# Patient Record
Sex: Female | Born: 1965 | Race: White | Hispanic: No | Marital: Married | Smoking: Never smoker
Health system: Southern US, Community
[De-identification: ages and names within clinical notes are randomized; demographics above are authoritative.]

## PROBLEM LIST (undated history)

## (undated) DIAGNOSIS — U071 COVID-19: Secondary | ICD-10-CM

## (undated) DIAGNOSIS — Z9189 Other specified personal risk factors, not elsewhere classified: Secondary | ICD-10-CM

## (undated) HISTORY — PX: BREAST EXCISIONAL BIOPSY: SUR124

## (undated) HISTORY — PX: ABDOMINAL HYSTERECTOMY: SHX81

## (undated) HISTORY — DX: COVID-19: U07.1

---
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
## 2002-03-23 ENCOUNTER — Encounter: Payer: Self-pay | Admitting: Obstetrics and Gynecology

## 2002-03-23 ENCOUNTER — Ambulatory Visit (HOSPITAL_COMMUNITY): Admission: RE | Admit: 2002-03-23 | Discharge: 2002-03-23 | Payer: Self-pay | Admitting: Obstetrics and Gynecology

## 2002-10-30 ENCOUNTER — Observation Stay (HOSPITAL_COMMUNITY): Admission: RE | Admit: 2002-10-30 | Discharge: 2002-10-31 | Payer: Self-pay | Admitting: Obstetrics and Gynecology

## 2002-10-30 ENCOUNTER — Encounter (INDEPENDENT_AMBULATORY_CARE_PROVIDER_SITE_OTHER): Payer: Self-pay | Admitting: Specialist

## 2005-02-02 ENCOUNTER — Other Ambulatory Visit: Admission: RE | Admit: 2005-02-02 | Discharge: 2005-02-02 | Payer: Self-pay | Admitting: Obstetrics and Gynecology

## 2005-02-12 ENCOUNTER — Encounter: Admission: RE | Admit: 2005-02-12 | Discharge: 2005-02-12 | Payer: Self-pay | Admitting: Obstetrics and Gynecology

## 2005-03-11 ENCOUNTER — Ambulatory Visit (HOSPITAL_BASED_OUTPATIENT_CLINIC_OR_DEPARTMENT_OTHER): Admission: RE | Admit: 2005-03-11 | Discharge: 2005-03-11 | Payer: Self-pay | Admitting: General Surgery

## 2005-03-11 ENCOUNTER — Ambulatory Visit (HOSPITAL_COMMUNITY): Admission: RE | Admit: 2005-03-11 | Discharge: 2005-03-11 | Payer: Self-pay | Admitting: General Surgery

## 2005-03-11 ENCOUNTER — Encounter (INDEPENDENT_AMBULATORY_CARE_PROVIDER_SITE_OTHER): Payer: Self-pay | Admitting: Specialist

## 2007-06-08 ENCOUNTER — Encounter: Admission: RE | Admit: 2007-06-08 | Discharge: 2007-06-08 | Payer: Self-pay | Admitting: Obstetrics and Gynecology

## 2008-10-09 ENCOUNTER — Encounter: Admission: RE | Admit: 2008-10-09 | Discharge: 2008-10-09 | Payer: Self-pay | Admitting: Obstetrics and Gynecology

## 2009-08-12 ENCOUNTER — Ambulatory Visit: Payer: Self-pay | Admitting: Family Medicine

## 2009-08-12 DIAGNOSIS — L989 Disorder of the skin and subcutaneous tissue, unspecified: Secondary | ICD-10-CM | POA: Insufficient documentation

## 2009-08-12 DIAGNOSIS — Z9189 Other specified personal risk factors, not elsewhere classified: Secondary | ICD-10-CM | POA: Insufficient documentation

## 2009-08-12 HISTORY — DX: Other specified personal risk factors, not elsewhere classified: Z91.89

## 2009-09-23 ENCOUNTER — Encounter: Payer: Self-pay | Admitting: Family Medicine

## 2009-09-26 ENCOUNTER — Ambulatory Visit: Payer: Self-pay | Admitting: Family Medicine

## 2009-09-26 DIAGNOSIS — R5383 Other fatigue: Secondary | ICD-10-CM

## 2009-09-26 DIAGNOSIS — R5381 Other malaise: Secondary | ICD-10-CM

## 2009-09-26 LAB — CONVERTED CEMR LAB
ALT: 15 units/L (ref 0–35)
AST: 16 units/L (ref 0–37)
Alkaline Phosphatase: 42 units/L (ref 39–117)
BUN: 10 mg/dL (ref 6–23)
Basophils Absolute: 0 10*3/uL (ref 0.0–0.1)
Calcium: 8.7 mg/dL (ref 8.4–10.5)
Eosinophils Relative: 0.7 % (ref 0.0–5.0)
GFR calc non Af Amer: 96.17 mL/min (ref >60)
Glucose, Bld: 66 mg/dL — ABNORMAL LOW (ref 70–99)
HCT: 41.6 % (ref 36.0–46.0)
HDL: 50.7 mg/dL (ref >39.00)
Hemoglobin: 13.9 g/dL (ref 12.0–15.0)
LDL Cholesterol: 92 mg/dL (ref 0–99)
Lymphocytes Relative: 24 % (ref 12.0–46.0)
Lymphs Abs: 1.6 10*3/uL (ref 0.7–4.0)
Monocytes Relative: 13.9 % — ABNORMAL HIGH (ref 3.0–12.0)
Platelets: 250 10*3/uL (ref 150.0–400.0)
Potassium: 4.1 meq/L (ref 3.5–5.1)
RDW: 13.2 % (ref 11.5–14.6)
Sodium: 135 meq/L (ref 135–145)
Total Bilirubin: 0.5 mg/dL (ref 0.3–1.2)
VLDL: 6.2 mg/dL (ref 0.0–40.0)
WBC: 6.5 10*3/uL (ref 4.5–10.5)

## 2009-10-07 ENCOUNTER — Ambulatory Visit: Payer: Self-pay | Admitting: Family Medicine

## 2009-10-10 ENCOUNTER — Encounter: Admission: RE | Admit: 2009-10-10 | Discharge: 2009-10-10 | Payer: Self-pay | Admitting: Obstetrics and Gynecology

## 2011-04-17 NOTE — H&P (Signed)
   NAME:  Heidi [name], Heidi [name]                           ACCOUNT NO.:  [id number]   MEDICAL RECORD NO.:  [id number]                   PATIENT TYPE:  OBV   LOCATION:  9399                                 FACILITY:  [facility]   PHYSICIAN:  Dineen Kid. Rana Snare, M.D.                 DATE OF BIRTH:  1966/04/22   DATE OF ADMISSION:  10/30/2002  DATE OF DISCHARGE:                                HISTORY & PHYSICAL   HISTORY OF PRESENT ILLNESS:  Heidi [name] is a 45 year old G2, P2, presented to  me in 8/02, having just moved to the city from [place]. She was recently  diagnosed enlarged uterus and remains relatively asymptomatic; however,  after one year of following, they have grown to approximately 14 weeks size.  She has also pelvic pressure because of uterine size, pelvic pressure and  fibroids. Presents today for laparoscopic assisted vaginal hysterectomy.   PAST MEDICAL HISTORY:  Negative.   PAST SURGICAL HISTORY:  Negative.   PAST OBSTETRICAL HISTORY:  She has had two vaginal deliveries.   MEDICATIONS:  None.   ALLERGIES:  No known drug allergies.   PHYSICAL EXAMINATION:  VITAL SIGNS:  Blood pressure 100/16, weight 116.  HEART:  Regular, rate, and rhythm.  LUNGS:  Clear to auscultation bilaterally.  ABDOMEN:  Nondistended, nontender. Uterus is palpable to approximately two  to three fingerbreadths above the pubic symphysis. Mobility of the uterus is  more elongated than it is globular.   LABORATORY DATA:  Ultrasound shows an enlarged uterus. Last ultrasound in  11/02:  Uterus measuring 9 x 6 x 6 cm with fibroids throughout with the  largest measuring 5.3 cm in size. Ovaries appeared normal.   IMPRESSION AND PLAN:  A 14-week size fibroid uterus, pelvic pressure.   PLAN:  Laparoscopic assisted vaginal hysterectomy. The patient has no  further childbearing desires and wishes definite surgical treatment. Risks  and benefits were discussed at length which includes but is not limited to  risk of  infection, bleeding; damage to bowel, bladder, ureters; risks  associated with blood transfusion and anesthesia. Informed consent was  obtained.                                               Dineen Kid Rana Snare, M.D.    DCL/MEDQ  D:  10/30/2002  T:  10/30/2002  Job:  [id number]

## 2011-04-17 NOTE — Op Note (Signed)
[name], Heidi                 ACCOUNT NO.:  [id number]   MEDICAL RECORD NO.:  [id number]          PATIENT TYPE:  AMB   LOCATION:  DSC                          FACILITY:  [facility]   PHYSICIAN:  Rose Phi. Maple Hudson, M.D.   DATE OF BIRTH:  10-29-1966   DATE OF PROCEDURE:  03/11/2005  DATE OF DISCHARGE:                                 OPERATIVE REPORT   PREOPERATIVE DIAGNOSIS:  Radial scar left breast.   POSTOPERATIVE DIAGNOSIS:  Radial scar left breast.   OPERATION:  Excision of radial scar left breast.   SURGEON:  Rose Phi. Maple Hudson, M.D.   ANESTHESIA:  MAC   OPERATIVE PROCEDURE:  This 45 year old female had presented with a routine  mammogram showing a radial scar deformity at the 12 o'clock position of the  left breast with some associated cystic change. She had a palpable area of  abnormality and was scheduled for a left breast biopsy.   The patient was placed on the operating table with arms extended on the arm  board; and the left breast prepped and draped in usual fashion. The area of  concern was palpable at the 12 o'clock position just above the areola; and  so a curved incision was then outlined layer and the area thoroughly  infiltrated with a local anesthetic. Incision was made and exposed the  breast tissue and excised this which was rather hard and very fibrotic.  Hemostasis obtained with the cautery. Several little cysts were ruptured as  we did that. I then closed the skin with a 4-0 subcuticular Monocryl with  Steri-Strips. Specimen submitted to the pathologist.   Dressing was applied; and the patient transferred to recovery room in  satisfactory condition having tolerated procedure well.      PRY/MEDQ  D:  03/11/2005  T:  03/11/2005  Job:  [id number]

## 2011-04-17 NOTE — Op Note (Signed)
NAME:  Heidi [name], Heidi [name]                           ACCOUNT NO.:  [id number]   MEDICAL RECORD NO.:  [id number]                   PATIENT TYPE:  OBV   LOCATION:  9399                                 [facility]ACILITY:  [facility]   PHYSICIAN:  Dineen Kid. Rana Snare, M.D.                 DATE OF BIRTH:  09-08-1966   DATE OF PROCEDURE:  10/30/2002  DATE OF DISCHARGE:                                 OPERATIVE REPORT   PREOPERATIVE DIAGNOSIS:  A 14-week size fibroid uterus with pelvic pressure.   POSTOPERATIVE DIAGNOSES:  1. A 14-week size fibroid uterus with pelvic pressure.  2. Left paratubal cyst.   PROCEDURE:  Laparoscopically assisted vaginal hysterectomy and left  paratubal cyst removal.   SURGEON:  Dineen Kid. Rana Snare, M.D.   ASSISTANT:  Duke Salvia. Marcelle Overlie, M.D.   ANESTHESIA:  General endotracheal anesthesia.   INDICATIONS FOR PROCEDURE:  The patient is a 45 year old G2, P2, with  enlarging uterus first diagnosed one year ago, now measuring approximately  14-week size.  Because of enlarging uterus, 14-week size and pelvic pressure  and pelvic pressure, she presents today for laparoscopically assisted  vaginal hysterectomy.  Risks and benefits were discussed at length which  include, but are not limited to, risks of infection, bleeding, damage to  bowel, bladder or ureters; risk of subsequent blood transfusion, anesthesia.  Informed consent was obtained.   FINDINGS:  A 14-week size fibroid uterus, normal-appearing ovaries. There  was a left paratubal cyst.  Normal-appearing appendix, gallbladder and  liver.   DESCRIPTION OF PROCEDURE:  After adequate analgesia, the patient was placed  in the dorsal lithotomy position, sterilely prepped and draped.  The bladder  was sterilely drained.  Hulka tenaculum was placed in the anterior lip of  the cervix.  A 1 cm infraumbilical skin incision was made.  A Veress needle  was inserted.  The abdomen was insufflated with dullness to percussion.  An  11 mm trocar was  inserted and the above findings were noted.  A 5 mm trocar  was inserted to the left of midline two fingerbreadths above the pubic  symphysis under direct visualization.  The gyrus tripolar ligature was used  to ligate down the left and right utero-ovarian ligaments across the  fallopian tubes, across the utero-ovarian ligaments, down across the round  ligament with good hemostasis achieved.  At this point the ovary and tube  fell lateral.  The bladder was elevated with grasper.  A small incision was  made at the reflexion of the bladder to the uterus.  It was irrigated with  irrigant.  At this point, good hemostasis was achieved.  The abdomen was  desufflated.  The legs were elevated.  A Jacobs tenaculum was placed in the  posterior lip of the cervix.  A posterior colpotomy was then performed.  Cervix was circumscribed with Bovie cautery.  LigaSure ligator was used to  ligate  the uterosacral ligaments bilaterally and cut.  The cardinal  ligaments were ligated  and cut bilaterally.  The bladder was dissected off  the anterior insertion of the cervix, entered anteriorly and Deaver  retractor placed underneath the bladder.  LigaSure was used successfully  across the uterine vasculature, inferior portion of the broad ligaments  bilaterally.  Good hemostasis was achieved along the way.  At this point the  uterine fundus was grasped, delivered into the introitus, and the uterus was  removed.  At this time, Verda Cumins packing was placed.  The uterosacral  ligaments were identified and ligated with a suture of 0 Monocryl in figure-  of-eight fashion.  Posterior peritoneum was then closed in a pursestring  fashion.  The posterior fascial mucosa was then closed in vertical fashion  using figure-of-eight and 0 Monocryl.  The packing was then removed and the  anterior vaginal mucosa was closed in similar fashion using figure-of-eights  and 0 Monocryl with good approximation and good hemostasis.  Foley  catheter  was placed with return of clear yellow urine.  The patient had asked a small  skin tag to be removed from the right perineal body.  This was removed  easily with Bovie cautery and closed with 3-0 Vicryl subcuticular stitch in  a horizontal mattress.  Legs were lowered.  The abdomen was reinsufflated  and examination with the laparoscope was carried out.  The gyrus was used to  ligate several small peritoneal edges that were bleeding.  The paratubal  cyst was grasped, ligated with the  gyrus and easily removed.  After copious  irrigation, adequate hemostasis was assured, the abdomen was desufflated.  The trocars were removed.  The infraumbilical skin incision was closed with  0 Vicryl figure-of-eight in the fascia and the 3-0 Vicryl Rapide  subcuticular stitch, 5 mm trocar site closed with 3-0 Vicryl Rapide  subcuticular stitch and the incisions were injected with 0.25% Marcaine.  The patient was stable on transfer to the recovery room.  Sponge, needle and  instrument counts were correct x3.  Estimated blood loss was 200 cc.  The  patient received 1 g of cefotetan preoperatively.                                               Dineen Kid Rana Snare, M.D.    DCL/MEDQ  D:  10/30/2002  T:  10/30/2002  Job:  [id number]

## 2017-11-09 LAB — HM DEXA SCAN

## 2018-10-24 LAB — HM MAMMOGRAPHY

## 2018-10-24 LAB — HM PAP SMEAR: HM Pap smear: NEGATIVE

## 2019-05-11 ENCOUNTER — Telehealth: Payer: Self-pay | Admitting: Family Medicine

## 2019-05-11 NOTE — Telephone Encounter (Signed)
Heidi [name],   I do not want to take on new primary care patients for the remainder of my career.  10 years is new.    Heidi [name], with some of the turnover, can you make sure everyone knows that? Y'all don't need to ask. From now on, I won't be accepting transfers or family member new patients either.   I appreciate your help!

## 2019-05-11 NOTE — Telephone Encounter (Signed)
Patient called today to request an appointment, She has not been seen in 10 years and wanted to see if she is able to get reestablished. Patient also mentioned about an anti body test for the covid and if we could point her in the right directions where she could get this done.   Are you able to see her so she can be reestablished or okay to see if she could see someone else in the office?    Thanks    Patient's Phone-  [phone]

## 2019-05-11 NOTE — Telephone Encounter (Signed)
Noted  

## 2019-05-18 ENCOUNTER — Encounter: Payer: Self-pay | Admitting: Primary Care

## 2019-05-18 ENCOUNTER — Ambulatory Visit: Payer: No Typology Code available for payment source | Admitting: Primary Care

## 2019-05-18 ENCOUNTER — Other Ambulatory Visit: Payer: Self-pay

## 2019-05-18 VITALS — BP 130/84 | HR 81 | Temp 98.0°F | Ht 62.5 in | Wt 139.0 lb

## 2019-05-18 DIAGNOSIS — I499 Cardiac arrhythmia, unspecified: Secondary | ICD-10-CM

## 2019-05-18 DIAGNOSIS — Z8249 Family history of ischemic heart disease and other diseases of the circulatory system: Secondary | ICD-10-CM

## 2019-05-18 NOTE — Assessment & Plan Note (Signed)
Noted on exam today, patient also told about this several months ago.   ECG today with NSR with rate of 75. No PAC/PVC, ST changes. ECG did not capture the irregular beat that was noted on auscultation.   Give significant family history of heart disease, the irregular rhythm that was noted upon auscultation without capturing ECG, will send to cardiology for evaluation. She agrees.

## 2019-05-18 NOTE — Patient Instructions (Signed)
You will be contacted regarding your referral to Cardiology.  Please let us know if you have not been contacted within one week.   Please schedule a physical with me for November/December this year. You may also schedule a lab only appointment 3-4 days prior. We will discuss your lab results in detail during your physical.  It was a pleasure to meet you today! Please don't hesitate to call or message me with any questions. Welcome to [facility]!

## 2019-05-18 NOTE — Progress Notes (Signed)
Subjective:    Patient ID: Heidi [name], female    DOB: 11-27-1966, 53 y.o.   MRN: [id number]  HPI  Ms. Heidi [name] is a 53 year old female who presents today to establish care and discuss the problems mentioned below. Will obtain/review records.  She is following with her GYN for routine care but would like to transition her care to a PCP. She had her mammogram and pap smear in November 2019. Also had labs completed in 2018 endorses normal labs. She will be due for her annual exam in November/December 2020.  1) Irregular Heart Rhythm: Notified of this several months ago when she completed a basic health assessment through an outside company. She was told that she "had an extra heartbeat" and that it was nothing to worry about. She denies chest pain, dizziness, shortness of breath. She has a family history of atrial fibrillation and CAD in her mother, CAD with cardiac bypass in her father. She denies every having an ECG.  Review of Systems  Respiratory: Negative for shortness of breath.   Cardiovascular: Negative for chest pain.  Genitourinary:       Hysterectomy  Neurological: Negative for dizziness and headaches.       History reviewed. No pertinent past medical history.   Social History   Socioeconomic History  . Marital status: Divorced    Spouse name: Not on file  . Number of children: Not on file  . Years of education: Not on file  . Highest education level: Not on file  Occupational History  . Not on file  Social Needs  . Financial resource strain: Not on file  . Food insecurity    Worry: Not on file    Inability: Not on file  . Transportation needs    Medical: Not on file    Non-medical: Not on file  Tobacco Use  . Smoking status: Never Smoker  . Smokeless tobacco: Never Used  Substance and Sexual Activity  . Alcohol use: Not Currently  . Drug use: Not on file  . Sexual activity: Not on file  Lifestyle  . Physical activity    Days per week: Not on file   Minutes per session: Not on file  . Stress: Not on file  Relationships  . Social Herbalist on phone: Not on file    Gets together: Not on file    Attends religious service: Not on file    Active member of club or organization: Not on file    Attends meetings of clubs or organizations: Not on file    Relationship status: Not on file  . Intimate partner violence    Fear of current or ex partner: Not on file    Emotionally abused: Not on file    Physically abused: Not on file    Forced sexual activity: Not on file  Other Topics Concern  . Not on file  Social History Narrative   Married.   2 children.   Works as a Media planner.    Enjoys playing musical instruments, sewing, cooking, gardening.    Past Surgical History:  Procedure Laterality Date  . ABDOMINAL HYSTERECTOMY      Family History  Problem Relation Age of Onset  . Atrial fibrillation Mother   . Macular degeneration Mother   . Hypertension Mother   . Heart attack Father 68  . CAD Father   . Heart disease Brother     No Known Allergies  No current  outpatient medications on file prior to visit.   No current facility-administered medications on file prior to visit.     BP 130/84   Pulse 81   Temp 98 F (36.7 C) (Tympanic)   Ht 5' 2.5" (1.588 m)   Wt 139 lb (63 kg)   SpO2 98%   BMI 25.02 kg/m    Objective:   Physical Exam  Constitutional: She appears well-nourished.  Neck: Neck supple.  Cardiovascular: Normal rate. A regularly irregular rhythm present.  Respiratory: Effort normal and breath sounds normal.  Skin: Skin is warm and dry.  Psychiatric: She has a normal mood and affect.           Assessment & Plan:

## 2019-05-25 ENCOUNTER — Encounter: Payer: Self-pay | Admitting: Primary Care

## 2019-06-13 ENCOUNTER — Other Ambulatory Visit: Payer: Self-pay

## 2019-06-13 DIAGNOSIS — E039 Hypothyroidism, unspecified: Secondary | ICD-10-CM | POA: Insufficient documentation

## 2019-06-13 DIAGNOSIS — R000 Tachycardia, unspecified: Secondary | ICD-10-CM | POA: Diagnosis present

## 2019-06-13 LAB — CBC
HCT: 41.1 % (ref 36.0–46.0)
Hemoglobin: 13.5 g/dL (ref 12.0–15.0)
MCH: 29.7 pg (ref 26.0–34.0)
MCHC: 32.8 g/dL (ref 30.0–36.0)
MCV: 90.5 fL (ref 80.0–100.0)
Platelets: 280 10*3/uL (ref 150–400)
RBC: 4.54 MIL/uL (ref 3.87–5.11)
RDW: 13.8 % (ref 11.5–15.5)
WBC: 8 10*3/uL (ref 4.0–10.5)
nRBC: 0 % (ref 0.0–0.2)

## 2019-06-13 NOTE — ED Triage Notes (Signed)
Pt arrives to ED via POV from home with c/o tachycardia x2 weeks. Pt states s/x's only happen at night when she lays down to sleep. Pt denies any c/o chest pain or SHOB, no N/V/D. Pt denies dizziness or lightheadedness, no unilateral weakness or numbness. Pt states she has a doctor appt on August 18th with the cardiologist, but didn't want to wait that long. Pt is A&O, in NAD; RR even, regular, or unlabored

## 2019-06-14 ENCOUNTER — Emergency Department
Admission: EM | Admit: 2019-06-14 | Discharge: 2019-06-14 | Disposition: A | Discharge status: Home / Self Care | Payer: No Typology Code available for payment source | Attending: Emergency Medicine | Admitting: Emergency Medicine

## 2019-06-14 ENCOUNTER — Emergency Department: Payer: No Typology Code available for payment source

## 2019-06-14 DIAGNOSIS — R002 Palpitations: Secondary | ICD-10-CM

## 2019-06-14 DIAGNOSIS — E039 Hypothyroidism, unspecified: Secondary | ICD-10-CM

## 2019-06-14 LAB — BASIC METABOLIC PANEL
Anion gap: 9 (ref 5–15)
BUN: 17 mg/dL (ref 6–20)
CO2: 28 mmol/L (ref 22–32)
Calcium: 9.3 mg/dL (ref 8.9–10.3)
Chloride: 103 mmol/L (ref 98–111)
Creatinine, Ser: 0.94 mg/dL (ref 0.44–1.00)
GFR calc Af Amer: >60 mL/min (ref >60)
GFR calc non Af Amer: >60 mL/min (ref >60)
Glucose, Bld: 106 mg/dL — ABNORMAL HIGH (ref 70–99)
Potassium: 3.6 mmol/L (ref 3.5–5.1)
Sodium: 140 mmol/L (ref 135–145)

## 2019-06-14 LAB — TSH: TSH: 6.213 u[IU]/mL — ABNORMAL HIGH (ref 0.350–4.500)

## 2019-06-14 LAB — TROPONIN I (HIGH SENSITIVITY): Troponin I (High Sensitivity): 3 ng/L (ref <18)

## 2019-06-14 NOTE — ED Notes (Signed)
Patient transported to X-ray at this time 

## 2019-06-14 NOTE — ED Notes (Signed)
Lab notified of TSH lab test as ordered by Dr Owens Shark. Lab tech reports they have a sufficient amount of blood leftover from an earlier draw and will begin running analysis at this time.

## 2019-06-14 NOTE — ED Provider Notes (Signed)
[facility] Emergency Department Provider Note    First MD Initiated Contact with Patient 06/14/19 [phone]     (approximate)  I have reviewed the triage vital signs and the nursing notes.   HISTORY  Chief Complaint Tachycardia    HPI Heidi [name] is a 53 y.o. female presents to the emergency department with a 2-week history of rapid/irregular heartbeat which patient states that she only notices at night.  Patient denies any chest pain no shortness of breath no nausea vomiting diarrhea.  Patient denies any dizziness no headaches.  Patient states that when she does notice it and it feels as though it is persistent and goes on all night.  Patient states that she feels that currently however patient's heart rate is currently 90 and normal sinus rhythm noted on the monitor.  Patient states that her younger brother recently had to have a pacer defibrillator placed however she is unsure why that was done.    Past Medical History:  Diagnosis Date  . CHICKENPOX, HX OF 08/12/2009   Qualifier: Diagnosis of  By: Ellin Mayhew CMA Deborra Medina), Heather      Patient Active Problem List   Diagnosis Date Noted  . Irregular heart rhythm 05/18/2019    Past Surgical History:  Procedure Laterality Date  . ABDOMINAL HYSTERECTOMY      Prior to Admission medications   Not on File    Allergies Patient has no known allergies.  Family History  Problem Relation Age of Onset  . Atrial fibrillation Mother   . Macular degeneration Mother   . Hypertension Mother   . Heart attack Father 72  . CAD Father   . Heart disease Brother     Social History Social History   Tobacco Use  . Smoking status: Never Smoker  . Smokeless tobacco: Never Used  Substance Use Topics  . Alcohol use: Not Currently  . Drug use: Not on file    Review of Systems Constitutional: No fever/chills Eyes: No visual changes. ENT: No sore throat. Cardiovascular: Denies chest pain. Respiratory:  Denies shortness of breath. Gastrointestinal: No abdominal pain.  No nausea, no vomiting.  No diarrhea.  No constipation. Genitourinary: Negative for dysuria. Musculoskeletal: Negative for neck pain.  Negative for back pain. Integumentary: Negative for rash. Neurological: Negative for headaches, focal weakness or numbness.   ____________________________________________   PHYSICAL EXAM:  VITAL SIGNS: ED Triage Vitals  Enc Vitals Group     BP 06/13/19 2341 (!) 170/85     Pulse Rate 06/13/19 2341 84     Resp 06/13/19 2341 17     Temp 06/13/19 2341 97.9 F (36.6 C)     Temp Source 06/13/19 2341 Oral     SpO2 06/13/19 2341 100 %     Weight 06/13/19 2338 61.2 kg (135 lb)     Height 06/13/19 2338 1.6 m (5\' 3" )     Head Circumference --      Peak Flow --      Pain Score 06/13/19 2338 0     Pain Loc --      Pain Edu? --      Excl. in [place]? --     Constitutional: Alert and oriented. Well appearing and in no acute distress. Eyes: Conjunctivae are normal. PERRL. EOMI. Head: Atraumatic.Marland Kitchen Mouth/Throat: Mucous membranes are moist.  Oropharynx non-erythematous. Neck: No stridor.   Cardiovascular: Normal rate, regular rhythm. Good peripheral circulation. Grossly normal heart sounds. Respiratory: Normal respiratory effort.  No retractions. No audible  wheezing. Gastrointestinal: Soft and nontender. No distention.  Musculoskeletal: No lower extremity tenderness nor edema. No gross deformities of extremities. Neurologic:  Normal speech and language. No gross focal neurologic deficits are appreciated.  Skin:  Skin is warm, dry and intact. No rash noted. Psychiatric: Mood and affect are normal. Speech and behavior are normal.  ____________________________________________   LABS (all labs ordered are listed, but only abnormal results are displayed)  Labs Reviewed  BASIC METABOLIC PANEL - Abnormal; Notable for the following components:      Result Value   Glucose, Bld 106 (*)    All other  components within normal limits  TSH - Abnormal; Notable for the following components:   TSH 6.213 (*)    All other components within normal limits  CBC  TROPONIN I (HIGH SENSITIVITY)   ____________________________________________  EKG  ED ECG REPORT I, Owenton N Avyukth Bontempo, the attending physician, personally viewed and interpreted this ECG.   Date: 06/13/2019  EKG Time: 11:48PM  Rate: 86  Rhythm: Normal Sinus Rhythm  Axis: Normal  Intervals:Normal  ST&T Change: None ________________________________  RADIOLOGY I, Basehor N Jillien Yakel, personally viewed and evaluated these images (plain radiographs) as part of my medical decision making, as well as reviewing the written report by the radiologist.  ED MD interpretation: Negative chest x-ray per radiologist.  Official radiology report(s): Dg Chest 2 View  Result Date: 06/14/2019 CLINICAL DATA:  Tachycardia for 2 weeks EXAM: CHEST - 2 VIEW COMPARISON:  None. FINDINGS: Normal heart size and mediastinal contours. No acute infiltrate or edema. No effusion or pneumothorax. No acute osseous findings. IMPRESSION: Negative chest. Electronically Signed   By: Monte Fantasia M.D.   On: 06/14/2019 05:59    ____________________________________________   PROCEDURES   Procedure(s) performed (including Critical Care):  Procedures   ____________________________________________   INITIAL IMPRESSION / MDM / [place] / ED COURSE  As part of my medical decision making, I reviewed the following data within the electronic MEDICAL RECORD NUMBER   53 year old female presented with above-stated history and physical exam secondary to palpitations.  EKG revealed normal sinus rhythm without any ectopy.  Urine ED stay patient remained normal sinus rhythm while she was on the monitor without any abnormality.  Laboratory data notable for an elevated TSH of 6.2.  Spoke with the patient at length regarding the importance of following up with  cardiology today for outpatient evaluation.  I also informed the patient of her elevated TSH and suspicion for hypothyroidism and as such the necessity of follow-up with her primary care provider.     ____________________________________________  FINAL CLINICAL IMPRESSION(S) / ED DIAGNOSES  Final diagnoses:  Hypothyroidism, unspecified type  Palpitations     MEDICATIONS GIVEN DURING THIS VISIT:  Medications - No data to display   ED Discharge Orders    None      *Please note:  Emonii Wienke was evaluated in Emergency Department on 06/14/2019 for the symptoms described in the history of present illness. She was evaluated in the context of the global COVID-19 pandemic, which necessitated consideration that the patient might be at risk for infection with the SARS-CoV-2 virus that causes COVID-19. Institutional protocols and algorithms that pertain to the evaluation of patients at risk for COVID-19 are in a state of rapid change based on information released by regulatory bodies including the CDC and federal and state organizations. These policies and algorithms were followed during the patient's care in the ED.  Some ED evaluations and interventions may  be delayed as a result of limited staffing during the pandemic.*  Note:  This document was prepared using Dragon voice recognition software and may include unintentional dictation errors.   Gregor Hams, MD 06/14/19 [id number]

## 2019-07-18 ENCOUNTER — Ambulatory Visit: Payer: No Typology Code available for payment source

## 2019-07-18 ENCOUNTER — Ambulatory Visit (INDEPENDENT_AMBULATORY_CARE_PROVIDER_SITE_OTHER): Payer: No Typology Code available for payment source

## 2019-07-18 ENCOUNTER — Encounter: Payer: Self-pay | Admitting: Cardiovascular Disease

## 2019-07-18 ENCOUNTER — Ambulatory Visit: Payer: No Typology Code available for payment source | Admitting: Cardiovascular Disease

## 2019-07-18 ENCOUNTER — Other Ambulatory Visit: Payer: Self-pay

## 2019-07-18 VITALS — BP 142/78 | HR 81 | Ht 62.5 in | Wt 139.0 lb

## 2019-07-18 DIAGNOSIS — I493 Ventricular premature depolarization: Secondary | ICD-10-CM

## 2019-07-18 DIAGNOSIS — R002 Palpitations: Secondary | ICD-10-CM | POA: Diagnosis not present

## 2019-07-18 NOTE — Patient Instructions (Signed)
Medication Instructions:  Your physician recommends that you continue on your current medications as directed. Please refer to the Current Medication list given to you today.  If you need a refill on your cardiac medications before your next appointment, please call your pharmacy.   Lab work: None ordered If you have labs (blood work) drawn today and your tests are completely normal, you will receive your results only by: Marland Kitchen MyChart Message (if you have MyChart) OR . A paper copy in the mail If you have any lab test that is abnormal or we need to change your treatment, we will call you to review the results.  Testing/Procedures: Your physician has requested that you have an echocardiogram. Echocardiography is a painless test that uses sound waves to create images of your heart. It provides your doctor with information about the size and shape of your heart and how well your heart's chambers and valves are working. This procedure takes approximately one hour. There are no restrictions for this procedure.  Your physician has recommended that you wear an zio monitor. zio monitors are medical devices that record the heart's electrical activity. Doctors most often [place] these monitors to diagnose arrhythmias. Arrhythmias are problems with the speed or rhythm of the heartbeat. The monitor is a small, portable device. You can wear one while you do your normal daily activities. This is usually used to diagnose what is causing palpitations/syncope (passing out).    Follow-Up: At [facility], you and your health needs are our priority.  As part of our continuing mission to provide you with exceptional heart care, we have created designated Provider Care Teams.  These Care Teams include your primary Cardiologist (physician) and Advanced Practice Providers (APPs -  Physician Assistants and Nurse Practitioners) who all work together to provide you with the care you need, when you need it. You will need a  follow up appointment as needed You may see  Dr. Fletcher Anon or one of the following Advanced Practice Providers on your designated Care Team:   Murray Hodgkins, NP Christell Faith, PA-C . Marrianne Mood, PA-C  Any Other Special Instructions Will Be Listed Below (If Applicable).  Your physician has recommended that you wear a Zio monitor. This monitor is a medical device that records the heart's electrical activity. Doctors most often use these monitors to diagnose arrhythmias. Arrhythmias are problems with the speed or rhythm of the heartbeat. The monitor is a small device applied to your chest. You can wear one while you do your normal daily activities. While wearing this monitor if you have any symptoms to push the button and record what you felt. Once you have worn this monitor for the period of time provider prescribed (Usually 14 days), you will return the monitor device in the postage paid box. Once it is returned they will download the data collected and provide [place] with a report which the provider will then review and we will call you with those results. Important tips:  1. Avoid showering during the first 24 hours of wearing the monitor. 2. Avoid excessive sweating to help maximize wear time. 3. Do not submerge the device, no hot tubs, and no swimming pools. 4. Keep any lotions or oils away from the patch. 5. After 24 hours you may shower with the patch on. Take brief showers with your back facing the shower head.  6. Do not remove patch once it has been placed because that will interrupt data and decrease adhesive wear time. 7. Push  the button when you have any symptoms and write down what you were feeling. 8. Once you have completed wearing your monitor, remove and place into box which has postage paid and place in your outgoing mailbox.  9. If for some reason you have misplaced your box then call our office and we can provide another box and/or mail it off for you.

## 2019-07-18 NOTE — Progress Notes (Signed)
Cardiology Office Note   Date:  07/18/2019   ID:  Heidi [name], DOB [birth year], MRN [id number]  PCP:  Pleas Koch, NP  Cardiologist:   Kathlyn Sacramento, MD   Chief Complaint  Patient presents with  . New Patient (Initial Visit)    Patient c/o palpitations and irregular HR. Meds reviewed verbally with patient.       History of Present Illness: Heidi [name] is a 53 y.o. female who was referred by [name] for evaluation of palpitations. She has no prior cardiac history and no significant chronic medical conditions.  She had a [employer] screening done last year and was told about the presence of PVCs.  However, she was not symptomatic.  She is a lifelong non-smoker.  She drinks occasional wine on average half a glass every day.  She was drinking 2 cups of coffee daily but cut down significantly recently.  Family history is remarkable for coronary artery disease affecting her father who had an MI in his early 18s but he had a poor lifestyle.  Her job is stressful.  Recently, she started feeling palpitations and tachycardia mostly at night and went to the emergency room for this reason.  Basic work-up was unremarkable except for mildly elevated TSH.  She was noted to have PVCs.  She denies any change in her weight and no change in bowel movements. Other than palpitations, she denies any chest pain, shortness of breath or dizziness.  She is very active and works a full-time job and in addition she grows her own vegetables.   Past Medical History:  Diagnosis Date  . CHICKENPOX, HX OF 08/12/2009   Qualifier: Diagnosis of  By: Ellin Mayhew CMA (AAMA), Heather      Past Surgical History:  Procedure Laterality Date  . ABDOMINAL HYSTERECTOMY       No current outpatient medications on file.   No current facility-administered medications for this visit.     Allergies:   Patient has no known allergies.    Social History:  The patient  reports that she has never smoked.  She has never used smokeless tobacco. She reports previous alcohol use.   Family History:  The patient's family history includes Atrial fibrillation in her mother; CAD in her father; Heart attack (age of onset: 75) in her father; Heart disease in her brother; Hypertension in her mother; Macular degeneration in her mother.    ROS:  Please see the history of present illness.   Otherwise, review of systems are positive for none.   All other systems are reviewed and negative.    PHYSICAL EXAM: VS:  BP (!) 142/78 (BP Location: Right Arm, Patient Position: Sitting, Cuff Size: Normal)   Pulse 81   Ht 5' 2.5" (1.588 m)   Wt 139 lb (63 kg)   BMI 25.02 kg/m  , BMI Body mass index is 25.02 kg/m. GEN: Well nourished, well developed, in no acute distress  HEENT: normal  Neck: no JVD, carotid bruits, or masses Cardiac: RRR; no murmurs, rubs, or gallops,no edema  Respiratory:  clear to auscultation bilaterally, normal work of breathing GI: soft, nontender, nondistended, + BS MS: no deformity or atrophy  Skin: warm and dry, no rash Neuro:  Strength and sensation are intact Psych: euthymic mood, full affect   EKG:  EKG is ordered today. The ekg ordered today demonstrates normal sinus rhythm with possible left atrial enlargement and frequent PVCs.   Recent Labs: 06/13/2019: BUN 17; Creatinine, Ser 0.94;  Hemoglobin 13.5; Platelets 280; Potassium 3.6; Sodium 140; TSH 6.213    Lipid Panel    Component Value Date/Time   CHOL 149 09/26/2009 0913   TRIG 31.0 09/26/2009 0913   HDL 50.70 09/26/2009 0913   CHOLHDL 3 09/26/2009 0913   VLDL 6.2 09/26/2009 0913   LDLCALC 92 09/26/2009 0913      Wt Readings from Last 3 Encounters:  07/18/19 139 lb (63 kg)  06/13/19 135 lb (61.2 kg)  05/18/19 139 lb (63 kg)       No flowsheet data found.    ASSESSMENT AND PLAN:  1.  Symptomatic PVCs: She has no symptoms suggestive of ischemic heart disease.  She is very active.  We have to exclude  underlying structural heart abnormalities or cardiomyopathy and thus I requested an echocardiogram.  We also have to quantify her PVCs and I requested a 3-day ZIO patch monitor.  Her symptoms somewhat improved after decreasing caffeine.  She does not consume excessive amount of alcohol. Recent labs showed mildly elevated TSH but she has no symptoms suggestive of hypothyroidism.  In addition, that is not a known cause of PVCs.  2.  Mildly elevated TSH: Does not seem to be symptomatic.  I advised her to follow-up with her primary care physician for repeat testing and verification.    Disposition:   FU with me as needed  Signed,  Kathlyn Sacramento, MD  07/18/2019 3:50 PM    [place]

## 2019-08-02 ENCOUNTER — Other Ambulatory Visit: Payer: Self-pay | Admitting: *Deleted

## 2019-08-02 ENCOUNTER — Telehealth: Payer: Self-pay

## 2019-08-02 DIAGNOSIS — I493 Ventricular premature depolarization: Secondary | ICD-10-CM

## 2019-08-02 DIAGNOSIS — R002 Palpitations: Secondary | ICD-10-CM

## 2019-08-02 NOTE — Telephone Encounter (Addendum)
Called to give the patient monitor results and Dr. Tyrell Antonio recommendation. lmtcb.

## 2019-08-02 NOTE — Telephone Encounter (Signed)
-----   Message from Wellington Hampshire, MD sent at 08/02/2019 11:38 AM EDT ----- Inform patient that monitor showed very frequent PVCs.  Recommend adding Toprol 25 mg once daily.  Schedule follow-up appointment after echocardiogram is done.

## 2019-08-04 ENCOUNTER — Other Ambulatory Visit: Payer: Self-pay

## 2019-08-04 ENCOUNTER — Ambulatory Visit (INDEPENDENT_AMBULATORY_CARE_PROVIDER_SITE_OTHER): Payer: No Typology Code available for payment source

## 2019-08-04 DIAGNOSIS — I493 Ventricular premature depolarization: Secondary | ICD-10-CM | POA: Diagnosis not present

## 2019-08-04 NOTE — Telephone Encounter (Signed)
2nd attempt to contact the patient. lmtcb. 

## 2019-08-10 ENCOUNTER — Telehealth: Payer: Self-pay | Admitting: Primary Care

## 2019-08-10 DIAGNOSIS — Z1239 Encounter for other screening for malignant neoplasm of breast: Secondary | ICD-10-CM

## 2019-08-10 NOTE — Telephone Encounter (Signed)
Patient stated that she is due for her annual mammogram coming up Nov 25th  She would like to make sure that she does not need anything from our office before she schedules.     C/b # [id number] [phone]

## 2019-08-11 NOTE — Telephone Encounter (Signed)
-----   Message from Wellington Hampshire, MD sent at 08/11/2019  8:18 AM EDT ----- Inform patient that echo was normal.

## 2019-08-11 NOTE — Telephone Encounter (Signed)
Called to give the patient echo results. lmtcb   3rd attempt to reach the patient. Previous attempt have been made to reach the patient for zio-monitor results.

## 2019-08-11 NOTE — Telephone Encounter (Addendum)
Please order for patient. CPE is schedule for 10/31/2019

## 2019-08-12 NOTE — Telephone Encounter (Signed)
It appears that her prior mammograms were completed at her GYN's office. I'll place the order for [facility], but double check with patient that this location is preferred. Otherwise I can change to [facility] in [place].

## 2019-08-14 NOTE — Telephone Encounter (Signed)
Message left for patient to return my call.  

## 2019-08-15 ENCOUNTER — Telehealth: Payer: Self-pay

## 2019-08-15 NOTE — Telephone Encounter (Signed)
Patient made aware of echo and heart monitor results.  Notes recorded by Wellington Hampshire, MD on 08/02/2019 at 11:38 AM EDT  Inform patient that monitor showed very frequent PVCs. Recommend adding Toprol 25 mg once daily. Schedule follow-up appointment after echocardiogram is done.  Notes recorded by Wellington Hampshire, MD on 08/11/2019 at 8:18 AM EDT  Inform patient that echo was normal.   Patient sts that she will not take any prescribed medications and plans on "living a natural life and dying a natural death." She will research metoprolol but she is 99.9% sure she will not take it. She has made lifestyle changes to reduce stress and she has decreased her caffeine intake. Offered to schedule a f/u appt to discuss with Dr. Fletcher Anon. She declined.   Patient would like to know if Dr. Fletcher Anon has any "natural" recommendations. She would like to communicate through [place]. Mychart activation code sent to the patient's email she provided [email or web address].

## 2019-08-16 NOTE — Telephone Encounter (Signed)
I sent her a message

## 2019-08-16 NOTE — Telephone Encounter (Signed)
Message left for patient to return my call.  

## 2019-08-16 NOTE — Telephone Encounter (Signed)
Noted  

## 2019-10-11 ENCOUNTER — Other Ambulatory Visit: Payer: Self-pay | Admitting: Primary Care

## 2019-10-11 DIAGNOSIS — R7989 Other specified abnormal findings of blood chemistry: Secondary | ICD-10-CM

## 2019-10-11 DIAGNOSIS — R739 Hyperglycemia, unspecified: Secondary | ICD-10-CM

## 2019-10-11 DIAGNOSIS — Z1159 Encounter for screening for other viral diseases: Secondary | ICD-10-CM

## 2019-10-20 ENCOUNTER — Other Ambulatory Visit (INDEPENDENT_AMBULATORY_CARE_PROVIDER_SITE_OTHER): Payer: No Typology Code available for payment source

## 2019-10-20 DIAGNOSIS — R7989 Other specified abnormal findings of blood chemistry: Secondary | ICD-10-CM

## 2019-10-20 DIAGNOSIS — R739 Hyperglycemia, unspecified: Secondary | ICD-10-CM

## 2019-10-20 DIAGNOSIS — Z1159 Encounter for screening for other viral diseases: Secondary | ICD-10-CM

## 2019-10-23 LAB — COMPREHENSIVE METABOLIC PANEL
AG Ratio: 1.6 (calc) (ref 1.0–2.5)
ALT: 17 U/L (ref 6–29)
AST: 15 U/L (ref 10–35)
Albumin: 4.4 g/dL (ref 3.6–5.1)
Alkaline phosphatase (APISO): 65 U/L (ref 37–153)
BUN: 10 mg/dL (ref 7–25)
CO2: 27 mmol/L (ref 20–32)
Calcium: 9.9 mg/dL (ref 8.6–10.4)
Chloride: 101 mmol/L (ref 98–110)
Creat: 0.81 mg/dL (ref 0.50–1.05)
Globulin: 2.8 g/dL (calc) (ref 1.9–3.7)
Glucose, Bld: 91 mg/dL (ref 65–99)
Potassium: 3.9 mmol/L (ref 3.5–5.3)
Sodium: 139 mmol/L (ref 135–146)
Total Bilirubin: 0.4 mg/dL (ref 0.2–1.2)
Total Protein: 7.2 g/dL (ref 6.1–8.1)

## 2019-10-23 LAB — HEMOGLOBIN A1C
Hgb A1c MFr Bld: 5.6 % of total Hgb (ref <5.7)
Mean Plasma Glucose: 114 (calc)
eAG (mmol/L): 6.3 (calc)

## 2019-10-23 LAB — LIPID PANEL
Cholesterol: 194 mg/dL (ref <200)
HDL: 74 mg/dL (ref >=50)
LDL Cholesterol (Calc): 103 mg/dL (calc) — ABNORMAL HIGH
Non-HDL Cholesterol (Calc): 120 mg/dL (calc) (ref <130)
Total CHOL/HDL Ratio: 2.6 (calc) (ref <5.0)
Triglycerides: 84 mg/dL (ref <150)

## 2019-10-23 LAB — CBC
HCT: 44.4 % (ref 35.0–45.0)
Hemoglobin: 14.5 g/dL (ref 11.7–15.5)
MCH: 29.1 pg (ref 27.0–33.0)
MCHC: 32.7 g/dL (ref 32.0–36.0)
MCV: 89.2 fL (ref 80.0–100.0)
MPV: 11.6 fL (ref 7.5–12.5)
Platelets: 280 10*3/uL (ref 140–400)
RBC: 4.98 10*6/uL (ref 3.80–5.10)
RDW: 12.2 % (ref 11.0–15.0)
WBC: 6.4 10*3/uL (ref 3.8–10.8)

## 2019-10-23 LAB — TSH: TSH: 1.8 mIU/L

## 2019-10-23 LAB — HEPATITIS C ANTIBODY
Hepatitis C Ab: NONREACTIVE
SIGNAL TO CUT-OFF: 0.02 (ref <1.00)

## 2019-10-30 ENCOUNTER — Ambulatory Visit
Admission: RE | Admit: 2019-10-30 | Discharge: 2019-10-30 | Disposition: A | Discharge status: Home / Self Care | Payer: No Typology Code available for payment source | Source: Ambulatory Visit | Attending: Primary Care | Admitting: Primary Care

## 2019-10-30 DIAGNOSIS — Z1231 Encounter for screening mammogram for malignant neoplasm of breast: Secondary | ICD-10-CM | POA: Diagnosis present

## 2019-10-30 DIAGNOSIS — Z1239 Encounter for other screening for malignant neoplasm of breast: Secondary | ICD-10-CM

## 2019-10-31 ENCOUNTER — Other Ambulatory Visit: Payer: Self-pay

## 2019-10-31 ENCOUNTER — Ambulatory Visit (INDEPENDENT_AMBULATORY_CARE_PROVIDER_SITE_OTHER): Payer: No Typology Code available for payment source | Admitting: Primary Care

## 2019-10-31 ENCOUNTER — Encounter: Payer: Self-pay | Admitting: Primary Care

## 2019-10-31 VITALS — BP 130/86 | HR 85 | Temp 96.6°F | Ht 62.5 in | Wt 143.2 lb

## 2019-10-31 DIAGNOSIS — R7989 Other specified abnormal findings of blood chemistry: Secondary | ICD-10-CM | POA: Insufficient documentation

## 2019-10-31 DIAGNOSIS — Z23 Encounter for immunization: Secondary | ICD-10-CM | POA: Diagnosis not present

## 2019-10-31 DIAGNOSIS — I499 Cardiac arrhythmia, unspecified: Secondary | ICD-10-CM

## 2019-10-31 DIAGNOSIS — Z0000 Encounter for general adult medical examination without abnormal findings: Secondary | ICD-10-CM

## 2019-10-31 NOTE — Patient Instructions (Signed)
Start exercising. You should be getting 150 minutes of moderate intensity exercise weekly.  Continue to work on a healthy diet. Ensure you are consuming 64 ounces of water daily.  Schedule a nurse visit for three months for your second Shingrix vaccine. Schedule a lab visit for three months to repeat your thyroid function.  It was a pleasure to see you today!   Preventive Care 46-55 Years Old, Female Preventive care refers to visits with your health care provider and lifestyle choices that can promote health and wellness. This includes:  A yearly physical exam. This may also be called an annual well check.  Regular dental visits and eye exams.  Immunizations.  Screening for certain conditions.  Healthy lifestyle choices, such as eating a healthy diet, getting regular exercise, not using drugs or products that contain nicotine and tobacco, and limiting alcohol use. What can I expect for my preventive care visit? Physical exam Your health care provider will check your:  Height and weight. This may be used to calculate body mass index (BMI), which tells if you are at a healthy weight.  Heart rate and blood pressure.  Skin for abnormal spots. Counseling Your health care provider may ask you questions about your:  Alcohol, tobacco, and drug use.  Emotional well-being.  Home and relationship well-being.  Sexual activity.  Eating habits.  Work and work Statistician.  Method of birth control.  Menstrual cycle.  Pregnancy history. What immunizations do I need?  Influenza (flu) vaccine  This is recommended every year. Tetanus, diphtheria, and pertussis (Tdap) vaccine  You may need a Td booster every 10 years. Varicella (chickenpox) vaccine  You may need this if you have not been vaccinated. Zoster (shingles) vaccine  You may need this after age 20. Measles, mumps, and rubella (MMR) vaccine  You may need at least one dose of MMR if you were born in 1957 or  later. You may also need a second dose. Pneumococcal conjugate (PCV13) vaccine  You may need this if you have certain conditions and were not previously vaccinated. Pneumococcal polysaccharide (PPSV23) vaccine  You may need one or two doses if you smoke cigarettes or if you have certain conditions. Meningococcal conjugate (MenACWY) vaccine  You may need this if you have certain conditions. Hepatitis A vaccine  You may need this if you have certain conditions or if you travel or work in places where you may be exposed to hepatitis A. Hepatitis B vaccine  You may need this if you have certain conditions or if you travel or work in places where you may be exposed to hepatitis B. Haemophilus influenzae type b (Hib) vaccine  You may need this if you have certain conditions. Human papillomavirus (HPV) vaccine  If recommended by your health care provider, you may need three doses over 6 months. You may receive vaccines as individual doses or as more than one vaccine together in one shot (combination vaccines). Talk with your health care provider about the risks and benefits of combination vaccines. What tests do I need? Blood tests  Lipid and cholesterol levels. These may be checked every 5 years, or more frequently if you are over 70 years old.  Hepatitis C test.  Hepatitis B test. Screening  Lung cancer screening. You may have this screening every year starting at age 19 if you have a 30-pack-year history of smoking and currently smoke or have quit within the past 15 years.  Colorectal cancer screening. All adults should have this screening starting  at age 35 and continuing until age 50. Your health care provider may recommend screening at age 62 if you are at increased risk. You will have tests every 1-10 years, depending on your results and the type of screening test.  Diabetes screening. This is done by checking your blood sugar (glucose) after you have not eaten for a while  (fasting). You may have this done every 1-3 years.  Mammogram. This may be done every 1-2 years. Talk with your health care provider about when you should start having regular mammograms. This may depend on whether you have a family history of breast cancer.  BRCA-related cancer screening. This may be done if you have a family history of breast, ovarian, tubal, or peritoneal cancers.  Pelvic exam and Pap test. This may be done every 3 years starting at age 46. Starting at age 22, this may be done every 5 years if you have a Pap test in combination with an HPV test. Other tests  Sexually transmitted disease (STD) testing.  Bone density scan. This is done to screen for osteoporosis. You may have this scan if you are at high risk for osteoporosis. Follow these instructions at home: Eating and drinking  Eat a diet that includes fresh fruits and vegetables, whole grains, lean protein, and low-fat dairy.  Take vitamin and mineral supplements as recommended by your health care provider.  Do not drink alcohol if: ? Your health care provider tells you not to drink. ? You are pregnant, may be pregnant, or are planning to become pregnant.  If you drink alcohol: ? Limit how much you have to 0-1 drink a day. ? Be aware of how much alcohol is in your drink. In the [place] one drink equals one 12 oz bottle of beer (355 mL), one 5 oz glass of wine (148 mL), or one 1 oz glass of hard liquor (44 mL). Lifestyle  Take daily care of your teeth and gums.  Stay active. Exercise for at least 30 minutes on 5 or more days each week.  Do not use any products that contain nicotine or tobacco, such as cigarettes, e-cigarettes, and chewing tobacco. If you need help quitting, ask your health care provider.  If you are sexually active, practice safe sex. Use a condom or other form of birth control (contraception) in order to prevent pregnancy and STIs (sexually transmitted infections).  If told by your health  care provider, take low-dose aspirin daily starting at age 58. What's next?  Visit your health care provider once a year for a well check visit.  Ask your health care provider how often you should have your eyes and teeth checked.  Stay up to date on all vaccines. This information is not intended to replace advice given to you by your health care provider. Make sure you discuss any questions you have with your health care provider. Document Released: 12/13/2015 Document Revised: 07/28/2018 Document Reviewed: 07/28/2018 Elsevier Patient Education  2020 [employer].

## 2019-10-31 NOTE — Addendum Note (Signed)
Addended by: Jacqualin Combes on: 10/31/2019 04:19 PM   Modules accepted: Orders

## 2019-10-31 NOTE — Progress Notes (Signed)
Subjective:    Patient ID: Heidi [name], female    DOB: 05/11/1966, 53 y.o.   MRN: [id number]  HPI  Heidi [name] is a 53 year old female who presents today for complete physical.  Immunizations: -Tetanus: Completed in 2013 -Influenza: Declines -Shingles: Never completed  Diet: She endorses a healthy diet. She is eating home cooked meals. Meat/vegetables/starch/fruit.  Exercise: She is not exercising, some walking.   Eye exam: Completed in 2020 Dental exam: Completes annually  Pap Smear: Completed in 2019 Mammogram: Completed in November 2020 Colonoscopy: Completed in 2017 Hep C Screen: Negative in 2020  BP Readings from Last 3 Encounters:  10/31/19 130/86  07/18/19 (!) 142/78  06/14/19 (!) 169/81     Review of Systems  Constitutional: Negative for unexpected weight change.  HENT: Negative for rhinorrhea.   Respiratory: Negative for cough and shortness of breath.   Cardiovascular: Negative for chest pain.  Gastrointestinal: Negative for constipation and diarrhea.  Genitourinary: Negative for difficulty urinating.  Musculoskeletal: Negative for arthralgias and myalgias.  Skin: Negative for rash.  Allergic/Immunologic: Negative for environmental allergies.  Neurological: Negative for dizziness, numbness and headaches.  Psychiatric/Behavioral: The patient is not nervous/anxious.        Past Medical History:  Diagnosis Date  . CHICKENPOX, HX OF 08/12/2009   Qualifier: Diagnosis of  By: Ellin Mayhew CMA (AAMA), Heather       Social History   Socioeconomic History  . Marital status: Divorced    Spouse name: Not on file  . Number of children: Not on file  . Years of education: Not on file  . Highest education level: Not on file  Occupational History  . Not on file  Social Needs  . Financial resource strain: Not on file  . Food insecurity    Worry: Not on file    Inability: Not on file  . Transportation needs    Medical: Not on file    Non-medical: Not on file   Tobacco Use  . Smoking status: Never Smoker  . Smokeless tobacco: Never Used  Substance and Sexual Activity  . Alcohol use: Not Currently  . Drug use: Not on file  . Sexual activity: Not on file  Lifestyle  . Physical activity    Days per week: Not on file    Minutes per session: Not on file  . Stress: Not on file  Relationships  . Social Herbalist on phone: Not on file    Gets together: Not on file    Attends religious service: Not on file    Active member of club or organization: Not on file    Attends meetings of clubs or organizations: Not on file    Relationship status: Not on file  . Intimate partner violence    Fear of current or ex partner: Not on file    Emotionally abused: Not on file    Physically abused: Not on file    Forced sexual activity: Not on file  Other Topics Concern  . Not on file  Social History Narrative   Married.   2 children.   Works as a Media planner.    Enjoys playing musical instruments, sewing, cooking, gardening.    Past Surgical History:  Procedure Laterality Date  . ABDOMINAL HYSTERECTOMY    . BREAST EXCISIONAL BIOPSY      Family History  Problem Relation Age of Onset  . Atrial fibrillation Mother   . Macular degeneration Mother   .  Hypertension Mother   . Heart attack Father 59  . CAD Father   . Heart disease Brother     No Known Allergies  No current outpatient medications on file prior to visit.   No current facility-administered medications on file prior to visit.     BP 130/86   Pulse 85   Temp (!) 96.6 F (35.9 C) (Temporal)   Ht 5' 2.5" (1.588 m)   Wt 143 lb 4 oz (65 kg)   SpO2 98%   BMI 25.78 kg/m    Objective:   Physical Exam  Constitutional: She is oriented to person, place, and time. She appears well-nourished.  HENT:  Right Ear: Tympanic membrane and ear canal normal.  Left Ear: Tympanic membrane and ear canal normal.  Mouth/Throat: Oropharynx is clear and moist.  Eyes: Pupils are  equal, round, and reactive to light. EOM are normal.  Neck: Neck supple.  Cardiovascular: Normal rate and regular rhythm.  Respiratory: Effort normal and breath sounds normal.  GI: Soft. Bowel sounds are normal. There is no abdominal tenderness.  Musculoskeletal: Normal range of motion.  Neurological: She is alert and oriented to person, place, and time. No cranial nerve deficit.  Reflex Scores:      Patellar reflexes are 2+ on the right side and 2+ on the left side. Skin: Skin is warm and dry.  Psychiatric: She has a normal mood and affect.           Assessment & Plan:

## 2019-10-31 NOTE — Assessment & Plan Note (Signed)
Noted on labs from ED in August 2020, repeat TSH recently unremarkable. Repeat in 3 months. She is asymptomatic.

## 2019-10-31 NOTE — Assessment & Plan Note (Signed)
Improved since taking potassium and magnesium supplements. Recent CMP unremarkable.

## 2019-10-31 NOTE — Assessment & Plan Note (Signed)
Tetanus UTD. Shingrix provided today. Pap smear UTD. Mammogram UTD. Colonoscopy UTD, due in 2027. Commended her on a healthy diet, encouraged regular exercise. Exam today unremarkable. Labs reviewed.

## 2019-11-02 ENCOUNTER — Other Ambulatory Visit: Payer: Self-pay | Admitting: Primary Care

## 2019-11-02 DIAGNOSIS — R928 Other abnormal and inconclusive findings on diagnostic imaging of breast: Secondary | ICD-10-CM

## 2019-11-17 ENCOUNTER — Ambulatory Visit
Admission: RE | Admit: 2019-11-17 | Discharge: 2019-11-17 | Disposition: A | Discharge status: Home / Self Care | Payer: No Typology Code available for payment source | Source: Ambulatory Visit | Attending: Primary Care | Admitting: Primary Care

## 2019-11-17 DIAGNOSIS — R928 Other abnormal and inconclusive findings on diagnostic imaging of breast: Secondary | ICD-10-CM | POA: Insufficient documentation

## 2019-11-20 ENCOUNTER — Other Ambulatory Visit: Payer: Self-pay | Admitting: Primary Care

## 2019-11-20 DIAGNOSIS — R928 Other abnormal and inconclusive findings on diagnostic imaging of breast: Secondary | ICD-10-CM

## 2019-11-20 DIAGNOSIS — N6320 Unspecified lump in the left breast, unspecified quadrant: Secondary | ICD-10-CM

## 2019-11-29 ENCOUNTER — Ambulatory Visit
Admission: RE | Admit: 2019-11-29 | Discharge: 2019-11-29 | Disposition: A | Discharge status: Home / Self Care | Payer: No Typology Code available for payment source | Source: Ambulatory Visit | Attending: Primary Care | Admitting: Primary Care

## 2019-11-29 DIAGNOSIS — R928 Other abnormal and inconclusive findings on diagnostic imaging of breast: Secondary | ICD-10-CM

## 2019-11-29 DIAGNOSIS — N6320 Unspecified lump in the left breast, unspecified quadrant: Secondary | ICD-10-CM

## 2019-11-29 DIAGNOSIS — D242 Benign neoplasm of left breast: Secondary | ICD-10-CM | POA: Insufficient documentation

## 2019-11-29 DIAGNOSIS — N62 Hypertrophy of breast: Secondary | ICD-10-CM | POA: Diagnosis not present

## 2019-11-29 DIAGNOSIS — N6032 Fibrosclerosis of left breast: Secondary | ICD-10-CM | POA: Diagnosis not present

## 2019-11-29 HISTORY — PX: BREAST BIOPSY: SHX20

## 2019-11-30 LAB — SURGICAL PATHOLOGY

## 2019-12-04 ENCOUNTER — Telehealth: Payer: Self-pay | Admitting: Primary Care

## 2019-12-04 ENCOUNTER — Telehealth: Payer: Self-pay | Admitting: *Deleted

## 2019-12-04 NOTE — Telephone Encounter (Signed)
Heidi [name] please attempt to contact patient? If we cannot get in touch with her then please send her a letter.

## 2019-12-04 NOTE — Telephone Encounter (Signed)
Per DPR, left detail message of Kate Clark's comments for patient to call back 

## 2019-12-04 NOTE — Telephone Encounter (Signed)
Patient called stating that she had a breast biopsy done Wednesday and may now have an infection. Patient stated that the area where the biopsy was done does not look infected, no tenderness or redness. Patient stated that she has a swollen gland on the left side of her neck that is sore to the touch. Patient stated that she has had a fever and headache that stated yesterday. Patient was offered a virtual visit with Allie Bossier NP in the morning which she declined stating that she is sure that she will be fine. Patient was given ER precautions. Patient stated that she will call back if she is not better.

## 2019-12-04 NOTE — Telephone Encounter (Signed)
Heidi [name], see my result note and phone note. The breast center has been trying to reach her, it's very important that she call them back.

## 2019-12-04 NOTE — Telephone Encounter (Signed)
Linda with [facility] called today She stated she has been trying to reach out to the patient to give her the results of her breast biopsy. They have made several attempts to reach the patient and has no success. She stated that the results are in epic and at this point radiology has to let the ordering provider know that they can not reach the patient and for our office to try to get in contact with the patient to give results.  She wanted to let you know that when you look in the results in the addendum the patient needed to follow up in 6 months for a mammogram and possible ultrasound.     Linda's Call back if you have any questions  [phone]

## 2019-12-05 NOTE — Telephone Encounter (Signed)
Noted. Addressed in the other enounter

## 2019-12-06 ENCOUNTER — Encounter: Payer: Self-pay | Admitting: Primary Care

## 2019-12-06 ENCOUNTER — Ambulatory Visit: Payer: No Typology Code available for payment source | Admitting: Primary Care

## 2019-12-06 ENCOUNTER — Other Ambulatory Visit: Payer: Self-pay

## 2019-12-06 DIAGNOSIS — R21 Rash and other nonspecific skin eruption: Secondary | ICD-10-CM | POA: Diagnosis not present

## 2019-12-06 MED ORDER — METHYLPREDNISOLONE ACETATE 80 MG/ML IJ SUSP
80.0000 mg | Freq: Once | INTRAMUSCULAR | Status: AC
  Administered 2019-12-06: 80 mg via INTRAMUSCULAR

## 2019-12-06 NOTE — Assessment & Plan Note (Signed)
Presentation today consistent with hives/allergic reaction, unclear etiology.   Suspect reaction is contributing to lymph node swelling and headaches. Lower risk for Covid-19. She doesn't appear sickly, no respiratory involvement. Breast biopsy site appears well.  IM Depo Medrol 80 mg provided today, patient held for 15 minutes without problems. Start oral antihistamine today. She will update.

## 2019-12-06 NOTE — Addendum Note (Signed)
Addended by: Jacqualin Combes on: 12/06/2019 12:50 PM   Modules accepted: Orders

## 2019-12-06 NOTE — Patient Instructions (Signed)
Start a daily antihistamine such as Claritin, Zyrtec, Allegra. Take this for about one week.  Please update me in 2 days regarding headache and rash. Call me sooner if you develop any other symptoms.  It was a pleasure to see you today!

## 2019-12-06 NOTE — Progress Notes (Signed)
Subjective:    Patient ID: Heidi [name], female    DOB: October 08, 1966, 54 y.o.   MRN: [id number]  HPI  This visit occurred during the SARS-CoV-2 public health emergency.  Safety protocols were in place, including screening questions prior to the visit, additional usage of staff PPE, and extensive cleaning of exam room while observing appropriate contact time as indicated for disinfecting solutions.   Heidi [name] is a 54 year old female with a recent history of potential left breast fat necrosis who presents today with a chief complaint of swollen glands and rash.  She noticed swollen glands to the left side of her neck six evenings ago after her left breast biopsy earlier that day. Three days ago she developed a occipital and parietal lobe headache that has been consistent since. She then noticed a rash last night to the upper chest, lower abdomen and bilateral lower extremities which is not itchy.   She's been taking Ibuprofen which she took for a few days starting three days ago, then switched to Tylenol last night. She underwent left breast biopsy six days ago and hasn't had pain, erythema, or drainage from the site. She's taken Ibuprofen in her late 61's and had no problems.   She denies fevers, cough, diarrhea, sore throat, chest congestion, rhinorrhea, new foods, unlaundered clothing, known exposure to Covid. She does go to work daily, everyone wears masks and practices social distancing.   Review of Systems  Constitutional: Negative for chills and fever.  HENT: Negative for congestion and sore throat.        Swelling and tenderness to left lateral neck  Respiratory: Negative for cough and shortness of breath.   Gastrointestinal: Negative for diarrhea.  Skin: Positive for rash.  Neurological: Negative for dizziness.       Past Medical History:  Diagnosis Date  . CHICKENPOX, HX OF 08/12/2009   Qualifier: Diagnosis of  By: Ellin Mayhew CMA (AAMA), Heather       Social History    Socioeconomic History  . Marital status: Married    Spouse name: Not on file  . Number of children: Not on file  . Years of education: Not on file  . Highest education level: Not on file  Occupational History  . Not on file  Tobacco Use  . Smoking status: Never Smoker  . Smokeless tobacco: Never Used  Substance and Sexual Activity  . Alcohol use: Not Currently  . Drug use: Not on file  . Sexual activity: Not on file  Other Topics Concern  . Not on file  Social History Narrative   Married.   2 children.   Works as a Media planner.    Enjoys playing musical instruments, sewing, cooking, gardening.   Social Determinants of Health   Financial Resource Strain:   . Difficulty of Paying Living Expenses: Not on file  Food Insecurity:   . Worried About Charity fundraiser in the Last Year: Not on file  . Ran Out of Food in the Last Year: Not on file  Transportation Needs:   . Lack of Transportation (Medical): Not on file  . Lack of Transportation (Non-Medical): Not on file  Physical Activity:   . Days of Exercise per Week: Not on file  . Minutes of Exercise per Session: Not on file  Stress:   . Feeling of Stress : Not on file  Social Connections:   . Frequency of Communication with Friends and Family: Not on file  . Frequency of  Social Gatherings with Friends and Family: Not on file  . Attends Religious Services: Not on file  . Active Member of Clubs or Organizations: Not on file  . Attends Archivist Meetings: Not on file  . Marital Status: Not on file  Intimate Partner Violence:   . Fear of Current or Ex-Partner: Not on file  . Emotionally Abused: Not on file  . Physically Abused: Not on file  . Sexually Abused: Not on file    Past Surgical History:  Procedure Laterality Date  . ABDOMINAL HYSTERECTOMY    . BREAST BIOPSY Left 11/29/2019   [place] bx, path pending, large mass heart clip  . BREAST BIOPSY Left 11/29/2019   [place] bx, path pending, small mass coil  clip  . BREAST EXCISIONAL BIOPSY      Family History  Problem Relation Age of Onset  . Atrial fibrillation Mother   . Macular degeneration Mother   . Hypertension Mother   . Heart attack Father 56  . CAD Father   . Heart disease Brother     No Known Allergies  No current outpatient medications on file prior to visit.   No current facility-administered medications on file prior to visit.    BP 134/84   Pulse (!) 101   Temp (!) 97.3 F (36.3 C) (Temporal)   Ht 5' 2.5" (1.588 m)   Wt 144 lb 8 oz (65.5 kg)   SpO2 98%   BMI 26.01 kg/m    Objective:   Physical Exam  Constitutional: She does not have a sickly appearance.  HENT:  Mouth/Throat: Oropharynx is clear and moist.  Eyes: Conjunctivae are normal.  Neck:  Minor enlargement of left cervical chain lymph node, distal to mandible. Slightly tender.   Respiratory: Effort normal and breath sounds normal. No respiratory distress. She has no wheezes.    Left breast with well healing area post biopsy. No erythema, tenderness, drainage.   Skin: Rash noted.  Erythematous rash to upper chest, lower abdomen, mid/lower back, bilateral upper portion of lower extremities. Slightly raised.             Assessment & Plan:

## 2020-01-25 ENCOUNTER — Other Ambulatory Visit: Payer: Self-pay | Admitting: Primary Care

## 2020-01-25 DIAGNOSIS — R7989 Other specified abnormal findings of blood chemistry: Secondary | ICD-10-CM

## 2020-01-31 ENCOUNTER — Other Ambulatory Visit (INDEPENDENT_AMBULATORY_CARE_PROVIDER_SITE_OTHER): Payer: No Typology Code available for payment source

## 2020-01-31 ENCOUNTER — Ambulatory Visit (INDEPENDENT_AMBULATORY_CARE_PROVIDER_SITE_OTHER): Payer: No Typology Code available for payment source

## 2020-01-31 DIAGNOSIS — R7989 Other specified abnormal findings of blood chemistry: Secondary | ICD-10-CM | POA: Diagnosis not present

## 2020-01-31 DIAGNOSIS — Z23 Encounter for immunization: Secondary | ICD-10-CM

## 2020-02-01 LAB — TSH: TSH: 2.67 u[IU]/mL (ref 0.35–4.50)

## 2020-02-01 LAB — T4, FREE: Free T4: 0.91 ng/dL (ref 0.60–1.60)

## 2020-05-28 ENCOUNTER — Other Ambulatory Visit: Payer: Self-pay | Admitting: Primary Care

## 2020-05-28 ENCOUNTER — Telehealth: Payer: Self-pay

## 2020-05-28 DIAGNOSIS — Z1231 Encounter for screening mammogram for malignant neoplasm of breast: Secondary | ICD-10-CM

## 2020-05-28 DIAGNOSIS — N6320 Unspecified lump in the left breast, unspecified quadrant: Secondary | ICD-10-CM

## 2020-05-28 NOTE — Telephone Encounter (Signed)
Patient contacted the office and states she is needing an order for a mammogram placed to [facility].

## 2020-05-28 NOTE — Telephone Encounter (Signed)
These orders were co-signed already.

## 2020-08-26 ENCOUNTER — Telehealth: Payer: Self-pay | Admitting: *Deleted

## 2020-08-26 NOTE — Telephone Encounter (Signed)
Patient called stating that she had a rapid covid test at work today and tested positive. Patient stated that she thinks that she may be on the tail end of covid. Patient stated that she had a scratchy throat last Monday or Tuesday but never felt bad. Patient stated that she and her husband are leaving Friday to go [place] to the [facility] and will be driving. . Patient stated that she worked all last week, but was sent home today .Patient stated that she feels fine and does not have a fever. Pateint stated that she was hoping that Allie Bossier NP would give her a prescription for Ivermectin to take with her in case she should get sick when she is on vacation. Advised patient that I am not aware of any our providers prescribing Ivermectin for covid. Patient stated that her friends have told her that it works great for covid and she does know that there are some providers that prescribe it. Patient was advised that a message will go back to [facility] for her to see review this.

## 2020-08-27 NOTE — Telephone Encounter (Signed)
Noted  

## 2020-08-27 NOTE — Telephone Encounter (Signed)
Called patient reviewed information she refuses "experimental" vaccination that has more side effects than the virus. She will cal around and find location to get rapid test done.

## 2020-08-27 NOTE — Telephone Encounter (Signed)
Please notify patient that I do not prescribe ivermectin. I also encourage her to consider vaccination against COVID-19 as this is currently the best prevention for contraction and spread of the virus.

## 2020-10-04 ENCOUNTER — Telehealth: Payer: Self-pay

## 2020-10-04 NOTE — Telephone Encounter (Signed)
[place] Night - Client Nonclinical Telephone Record Access[facility]urse Client [place] Night - Client Client Site [place] Physician [name] - NP Contact Type Call Who Is Calling Patient / Member / Family / Caregiver Caller Name Dwight Phone Number [phone] Patient Name Heidi [name] Patient DOB 1966-07-04 Call Type Message Only Information Provided Reason for Call Request for General Office Information Initial Comment Caller needs to know when appointment is. Additional Comment ext [phone]. Office hours provided. Disp. Time Disposition Final User 10/04/2020 7:19:12 AM General Information Provided Yes Marshell Garfinkel Call Closed By: Marshell Garfinkel Transaction Date/Time: 10/04/2020 7:17:30 AM (ET)

## 2020-10-04 NOTE — Telephone Encounter (Signed)
Called the patient to let her know of the appointment

## 2020-10-30 ENCOUNTER — Ambulatory Visit
Admission: RE | Admit: 2020-10-30 | Discharge: 2020-10-30 | Disposition: A | Discharge status: Home / Self Care | Payer: No Typology Code available for payment source | Source: Ambulatory Visit | Attending: Primary Care | Admitting: Primary Care

## 2020-10-30 ENCOUNTER — Other Ambulatory Visit: Payer: Self-pay

## 2020-10-30 DIAGNOSIS — Z1231 Encounter for screening mammogram for malignant neoplasm of breast: Secondary | ICD-10-CM | POA: Diagnosis present

## 2020-10-30 DIAGNOSIS — N6320 Unspecified lump in the left breast, unspecified quadrant: Secondary | ICD-10-CM | POA: Insufficient documentation

## 2020-11-05 ENCOUNTER — Encounter: Payer: Self-pay | Admitting: Primary Care

## 2020-11-05 ENCOUNTER — Ambulatory Visit (INDEPENDENT_AMBULATORY_CARE_PROVIDER_SITE_OTHER): Payer: No Typology Code available for payment source | Admitting: Primary Care

## 2020-11-05 ENCOUNTER — Other Ambulatory Visit: Payer: Self-pay | Admitting: Primary Care

## 2020-11-05 ENCOUNTER — Other Ambulatory Visit: Payer: Self-pay

## 2020-11-05 VITALS — BP 132/82 | HR 82 | Temp 97.6°F | Ht 62.5 in | Wt 139.0 lb

## 2020-11-05 DIAGNOSIS — R7989 Other specified abnormal findings of blood chemistry: Secondary | ICD-10-CM

## 2020-11-05 DIAGNOSIS — Z0000 Encounter for general adult medical examination without abnormal findings: Secondary | ICD-10-CM

## 2020-11-05 DIAGNOSIS — Z789 Other specified health status: Secondary | ICD-10-CM

## 2020-11-05 DIAGNOSIS — Z8249 Family history of ischemic heart disease and other diseases of the circulatory system: Secondary | ICD-10-CM | POA: Diagnosis not present

## 2020-11-05 DIAGNOSIS — I499 Cardiac arrhythmia, unspecified: Secondary | ICD-10-CM

## 2020-11-05 NOTE — Assessment & Plan Note (Signed)
Chronic and continued, evaluated by cardiology in 2020, benign etiology.   Noted on exam today. Rate regular.   Asymptomatic.

## 2020-11-05 NOTE — Patient Instructions (Addendum)
Stop by the lab prior to leaving today. I will notify you of your results once received.   Continue exercising. You should be getting 150 minutes of moderate intensity exercise weekly.  Continue to work on a healthy diet. Ensure you are consuming 64 ounces of water daily.  It was a pleasure to see you today!   Preventive Care 40-56 Years Old, Female Preventive care refers to visits with your health care provider and lifestyle choices that can promote health and wellness. This includes:  A yearly physical exam. This may also be called an annual well check.  Regular dental visits and eye exams.  Immunizations.  Screening for certain conditions.  Healthy lifestyle choices, such as eating a healthy diet, getting regular exercise, not using drugs or products that contain nicotine and tobacco, and limiting alcohol use. What can I expect for my preventive care visit? Physical exam Your health care provider will check your:  Height and weight. This may be used to calculate body mass index (BMI), which tells if you are at a healthy weight.  Heart rate and blood pressure.  Skin for abnormal spots. Counseling Your health care provider may ask you questions about your:  Alcohol, tobacco, and drug use.  Emotional well-being.  Home and relationship well-being.  Sexual activity.  Eating habits.  Work and work environment.  Method of birth control.  Menstrual cycle.  Pregnancy history. What immunizations do I need?  Influenza (flu) vaccine  This is recommended every year. Tetanus, diphtheria, and pertussis (Tdap) vaccine  You may need a Td booster every 10 years. Varicella (chickenpox) vaccine  You may need this if you have not been vaccinated. Zoster (shingles) vaccine  You may need this after age 60. Measles, mumps, and rubella (MMR) vaccine  You may need at least one dose of MMR if you were born in 1957 or later. You may also need a second dose. Pneumococcal  conjugate (PCV13) vaccine  You may need this if you have certain conditions and were not previously vaccinated. Pneumococcal polysaccharide (PPSV23) vaccine  You may need one or two doses if you smoke cigarettes or if you have certain conditions. Meningococcal conjugate (MenACWY) vaccine  You may need this if you have certain conditions. Hepatitis A vaccine  You may need this if you have certain conditions or if you travel or work in places where you may be exposed to hepatitis A. Hepatitis B vaccine  You may need this if you have certain conditions or if you travel or work in places where you may be exposed to hepatitis B. Haemophilus influenzae type b (Hib) vaccine  You may need this if you have certain conditions. Human papillomavirus (HPV) vaccine  If recommended by your health care provider, you may need three doses over 6 months. You may receive vaccines as individual doses or as more than one vaccine together in one shot (combination vaccines). Talk with your health care provider about the risks and benefits of combination vaccines. What tests do I need? Blood tests  Lipid and cholesterol levels. These may be checked every 5 years, or more frequently if you are over 50 years old.  Hepatitis C test.  Hepatitis B test. Screening  Lung cancer screening. You may have this screening every year starting at age 55 if you have a 30-pack-year history of smoking and currently smoke or have quit within the past 15 years.  Colorectal cancer screening. All adults should have this screening starting at age 50 and continuing until   age 75. Your health care provider may recommend screening at age 45 if you are at increased risk. You will have tests every 1-10 years, depending on your results and the type of screening test.  Diabetes screening. This is done by checking your blood sugar (glucose) after you have not eaten for a while (fasting). You may have this done every 1-3  years.  Mammogram. This may be done every 1-2 years. Talk with your health care provider about when you should start having regular mammograms. This may depend on whether you have a family history of breast cancer.  BRCA-related cancer screening. This may be done if you have a family history of breast, ovarian, tubal, or peritoneal cancers.  Pelvic exam and Pap test. This may be done every 3 years starting at age 21. Starting at age 30, this may be done every 5 years if you have a Pap test in combination with an HPV test. Other tests  Sexually transmitted disease (STD) testing.  Bone density scan. This is done to screen for osteoporosis. You may have this scan if you are at high risk for osteoporosis. Follow these instructions at home: Eating and drinking  Eat a diet that includes fresh fruits and vegetables, whole grains, lean protein, and low-fat dairy.  Take vitamin and mineral supplements as recommended by your health care provider.  Do not drink alcohol if: ? Your health care provider tells you not to drink. ? You are pregnant, may be pregnant, or are planning to become pregnant.  If you drink alcohol: ? Limit how much you have to 0-1 drink a day. ? Be aware of how much alcohol is in your drink. In the [place] one drink equals one 12 oz bottle of beer (355 mL), one 5 oz glass of wine (148 mL), or one 1 oz glass of hard liquor (44 mL). Lifestyle  Take daily care of your teeth and gums.  Stay active. Exercise for at least 30 minutes on 5 or more days each week.  Do not use any products that contain nicotine or tobacco, such as cigarettes, e-cigarettes, and chewing tobacco. If you need help quitting, ask your health care provider.  If you are sexually active, practice safe sex. Use a condom or other form of birth control (contraception) in order to prevent pregnancy and STIs (sexually transmitted infections).  If told by your health care provider, take low-dose aspirin daily  starting at age 50. What's next?  Visit your health care provider once a year for a well check visit.  Ask your health care provider how often you should have your eyes and teeth checked.  Stay up to date on all vaccines. This information is not intended to replace advice given to you by your health care provider. Make sure you discuss any questions you have with your health care provider. Document Revised: 07/28/2018 Document Reviewed: 07/28/2018 Elsevier Patient Education  2020 Elsevier Inc.    

## 2020-11-05 NOTE — Progress Notes (Signed)
Subjective:    Patient ID: Heidi [name], female    DOB: [birth year], 54 y.o.   MRN: [id number]  HPI  This visit occurred during the SARS-CoV-2 public health emergency.  Safety protocols were in place, including screening questions prior to the visit, additional usage of staff PPE, and extensive cleaning of exam room while observing appropriate contact time as indicated for disinfecting solutions.   Heidi [name] is a 54 year old female who presents today for complete physical.  Immunizations: -Tetanus: Completed in 2013 -Influenza: Declines  -Shingles: Completed series  -Covid-19: Declines   Diet: She endorses a healthy diet.  Exercise: She is walking, stationary bicycle   Eye exam: Completes annually  Dental exam: Completes annually   Pap Smear: Completed in 2019 Mammogram: Completed in December 2021 Colonoscopy: Completed 5-6 years ago, is not sure.  Hep C Screen: Negative  BP Readings from Last 3 Encounters:  11/05/20 132/82  12/06/19 134/84  10/31/19 130/86   Wt Readings from Last 3 Encounters:  11/05/20 139 lb (63 kg)  12/06/19 144 lb 8 oz (65.5 kg)  10/31/19 143 lb 4 oz (65 kg)     Review of Systems  Constitutional: Negative for unexpected weight change.  HENT: Negative for rhinorrhea.   Eyes: Negative for visual disturbance.  Respiratory: Negative for cough and shortness of breath.   Cardiovascular: Negative for chest pain.  Gastrointestinal: Negative for constipation and diarrhea.  Genitourinary: Negative for difficulty urinating.  Musculoskeletal: Negative for arthralgias and myalgias.  Skin: Negative for rash.  Allergic/Immunologic: Negative for environmental allergies.  Neurological: Negative for dizziness and headaches.  Psychiatric/Behavioral: The patient is not nervous/anxious.        Past Medical History:  Diagnosis Date  . CHICKENPOX, HX OF 08/12/2009   Qualifier: Diagnosis of  By: Ellin Mayhew CMA (Erin), Heather    . COVID-19 virus infection       Social History   Socioeconomic History  . Marital status: Married    Spouse name: Not on file  . Number of children: Not on file  . Years of education: Not on file  . Highest education level: Not on file  Occupational History  . Not on file  Tobacco Use  . Smoking status: Never Smoker  . Smokeless tobacco: Never Used  Substance and Sexual Activity  . Alcohol use: Not Currently  . Drug use: Not on file  . Sexual activity: Not on file  Other Topics Concern  . Not on file  Social History Narrative   Married.   2 children.   Works as a Media planner.    Enjoys playing musical instruments, sewing, cooking, gardening.   Social Determinants of Health   Financial Resource Strain:   . Difficulty of Paying Living Expenses: Not on file  Food Insecurity:   . Worried About Charity fundraiser in the Last Year: Not on file  . Ran Out of Food in the Last Year: Not on file  Transportation Needs:   . Lack of Transportation (Medical): Not on file  . Lack of Transportation (Non-Medical): Not on file  Physical Activity:   . Days of Exercise per Week: Not on file  . Minutes of Exercise per Session: Not on file  Stress:   . Feeling of Stress : Not on file  Social Connections:   . Frequency of Communication with Friends and Family: Not on file  . Frequency of Social Gatherings with Friends and Family: Not on file  . Attends Religious Services:  Not on file  . Active Member of Clubs or Organizations: Not on file  . Attends Archivist Meetings: Not on file  . Marital Status: Not on file  Intimate Partner Violence:   . Fear of Current or Ex-Partner: Not on file  . Emotionally Abused: Not on file  . Physically Abused: Not on file  . Sexually Abused: Not on file    Past Surgical History:  Procedure Laterality Date  . ABDOMINAL HYSTERECTOMY    . BREAST BIOPSY Left 11/29/2019   [place] bx, stromal fibrosis, large mass heart clip  . BREAST BIOPSY Left 11/29/2019   [place] bx, PASH,  small mass coil clip  . BREAST EXCISIONAL BIOPSY Left    years ago    Family History  Problem Relation Age of Onset  . Atrial fibrillation Mother   . Macular degeneration Mother   . Hypertension Mother   . Heart attack Father 61  . CAD Father   . Heart disease Brother   . Heart attack Brother 35  . Breast cancer Neg Hx     No Known Allergies  No current outpatient medications on file prior to visit.   No current facility-administered medications on file prior to visit.    BP 132/82   Pulse 82   Temp 97.6 F (36.4 C) (Temporal)   Ht 5' 2.5" (1.588 m)   Wt 139 lb (63 kg)   SpO2 98%   BMI 25.02 kg/m    Objective:   Physical Exam HENT:     Right Ear: Tympanic membrane and ear canal normal.     Left Ear: Tympanic membrane and ear canal normal.  Eyes:     Pupils: Pupils are equal, round, and reactive to light.  Cardiovascular:     Rate and Rhythm: Normal rate. Rhythm irregular.  Pulmonary:     Effort: Pulmonary effort is normal.     Breath sounds: Normal breath sounds.  Abdominal:     General: Bowel sounds are normal.     Palpations: Abdomen is soft.     Tenderness: There is no abdominal tenderness.  Musculoskeletal:        General: Normal range of motion.     Cervical back: Neck supple.  Skin:    General: Skin is warm and dry.  Neurological:     Mental Status: She is alert and oriented to person, place, and time.     Cranial Nerves: No cranial nerve deficit.     Deep Tendon Reflexes:     Reflex Scores:      Patellar reflexes are 2+ on the right side and 2+ on the left side. Psychiatric:        Mood and Affect: Mood normal.            Assessment & Plan:

## 2020-11-05 NOTE — Assessment & Plan Note (Addendum)
Heart attack in father at the age of 7 and again at 55. Heart disease in both brothers, recent passing of her younger brother at age 56 with heart attack.  Discussed patient's risk for heart disease, asked to consider statin therapy if warranted, she declines. Discussed her risk for heart disease given family history, she verbalized understanding.   Lipid panel pending.

## 2020-11-05 NOTE — Assessment & Plan Note (Signed)
Repeat TSH in March 2021 normal. Repeat TSH and Free T4 pending today.

## 2020-11-05 NOTE — Assessment & Plan Note (Signed)
Declines influenza vaccination. Other vaccines UTD. Pap smear UTD, due in 2022. Colonoscopy UTD per patient, she will obtain records of last colonoscopy.  Commended her on weight loss through diet and exercise. Encouraged to continue.  Exam today stable. Labs pending.

## 2020-11-05 NOTE — Addendum Note (Signed)
Addended by: Cloyd Stagers on: 11/05/2020 04:55 PM   Modules accepted: Orders

## 2020-11-06 LAB — LIPID PANEL
Cholesterol: 190 mg/dL (ref 0–200)
HDL: 68.9 mg/dL (ref >39.00)
LDL Cholesterol: 107 mg/dL — ABNORMAL HIGH (ref 0–99)
NonHDL: 120.61
Total CHOL/HDL Ratio: 3
Triglycerides: 68 mg/dL (ref 0.0–149.0)
VLDL: 13.6 mg/dL (ref 0.0–40.0)

## 2020-11-06 LAB — COMPREHENSIVE METABOLIC PANEL
ALT: 12 U/L (ref 0–35)
AST: 12 U/L (ref 0–37)
Albumin: 4.4 g/dL (ref 3.5–5.2)
Alkaline Phosphatase: 58 U/L (ref 39–117)
BUN: 14 mg/dL (ref 6–23)
CO2: 28 mEq/L (ref 19–32)
Calcium: 9.6 mg/dL (ref 8.4–10.5)
Chloride: 102 mEq/L (ref 96–112)
Creatinine, Ser: 0.86 mg/dL (ref 0.40–1.20)
GFR: 75.67 mL/min (ref >60.00)
Glucose, Bld: 83 mg/dL (ref 70–99)
Potassium: 4.1 mEq/L (ref 3.5–5.1)
Sodium: 137 mEq/L (ref 135–145)
Total Bilirubin: 0.3 mg/dL (ref 0.2–1.2)
Total Protein: 6.9 g/dL (ref 6.0–8.3)

## 2020-11-06 LAB — CBC
HCT: 42.8 % (ref 36.0–46.0)
Hemoglobin: 14 g/dL (ref 12.0–15.0)
MCHC: 32.6 g/dL (ref 30.0–36.0)
MCV: 87.7 fl (ref 78.0–100.0)
Platelets: 286 10*3/uL (ref 150.0–400.0)
RBC: 4.88 Mil/uL (ref 3.87–5.11)
RDW: 14.5 % (ref 11.5–15.5)
WBC: 7.1 10*3/uL (ref 4.0–10.5)

## 2020-11-06 LAB — TSH: TSH: 1.95 u[IU]/mL (ref 0.35–4.50)

## 2020-11-06 LAB — T4, FREE: Free T4: 0.96 ng/dL (ref 0.60–1.60)

## 2020-11-06 LAB — SARS-COV-2 ANTIBODY(IGG)SPIKE,SEMI-QUANTITATIVE: SARS COV1 AB(IGG)SPIKE,SEMI QN: 3.43 index — ABNORMAL HIGH (ref <1.00)

## 2020-11-08 ENCOUNTER — Telehealth: Payer: Self-pay | Admitting: Primary Care

## 2020-11-08 NOTE — Telephone Encounter (Signed)
PT CALLED IN NEEDS HELP TRYING TO LOCATE HER COLONOSCOPY AND SHE HAS COME TO [facility] TRYING TO GET HER REPORT FROM HER COLONOSCOPY, AND SHE HAD IT 08/2016  BY [facility]  [facility] HE MOVED TO [place] AND THEY DON'T HAVE HER RECORDS AND HE WAS IN [place] AND WAS REFERRED BY DR. Corinna Capra OF [facility].

## 2020-11-08 NOTE — Telephone Encounter (Signed)
Left message to return call to our office.  

## 2020-11-18 NOTE — Telephone Encounter (Signed)
Yes, I believe so.

## 2020-11-18 NOTE — Telephone Encounter (Signed)
Called [place] he was in practice for himself until he merged with [facility]. L/m at office to see if they can let me know if they have records.

## 2020-11-18 NOTE — Telephone Encounter (Signed)
Was this dr with [facility]?

## 2021-11-06 ENCOUNTER — Ambulatory Visit (INDEPENDENT_AMBULATORY_CARE_PROVIDER_SITE_OTHER): Payer: No Typology Code available for payment source | Admitting: Primary Care

## 2021-11-06 ENCOUNTER — Encounter: Payer: Self-pay | Admitting: Primary Care

## 2021-11-06 ENCOUNTER — Other Ambulatory Visit: Payer: Self-pay

## 2021-11-06 VITALS — BP 128/76 | HR 75 | Temp 97.7°F | Ht 62.5 in | Wt 141.0 lb

## 2021-11-06 DIAGNOSIS — Z8249 Family history of ischemic heart disease and other diseases of the circulatory system: Secondary | ICD-10-CM | POA: Diagnosis not present

## 2021-11-06 DIAGNOSIS — Z1231 Encounter for screening mammogram for malignant neoplasm of breast: Secondary | ICD-10-CM | POA: Diagnosis not present

## 2021-11-06 DIAGNOSIS — Z0000 Encounter for general adult medical examination without abnormal findings: Secondary | ICD-10-CM

## 2021-11-06 LAB — TSH: TSH: 1.42 u[IU]/mL (ref 0.35–5.50)

## 2021-11-06 LAB — COMPREHENSIVE METABOLIC PANEL
ALT: 18 U/L (ref 0–35)
AST: 16 U/L (ref 0–37)
Albumin: 4.2 g/dL (ref 3.5–5.2)
Alkaline Phosphatase: 52 U/L (ref 39–117)
BUN: 12 mg/dL (ref 6–23)
CO2: 28 mEq/L (ref 19–32)
Calcium: 9.7 mg/dL (ref 8.4–10.5)
Chloride: 103 mEq/L (ref 96–112)
Creatinine, Ser: 0.82 mg/dL (ref 0.40–1.20)
GFR: 79.56 mL/min (ref >60.00)
Glucose, Bld: 92 mg/dL (ref 70–99)
Potassium: 4 mEq/L (ref 3.5–5.1)
Sodium: 138 mEq/L (ref 135–145)
Total Bilirubin: 0.4 mg/dL (ref 0.2–1.2)
Total Protein: 6.9 g/dL (ref 6.0–8.3)

## 2021-11-06 LAB — LIPID PANEL
Cholesterol: 196 mg/dL (ref 0–200)
HDL: 63.5 mg/dL (ref >39.00)
LDL Cholesterol: 117 mg/dL — ABNORMAL HIGH (ref 0–99)
NonHDL: 132.09
Total CHOL/HDL Ratio: 3
Triglycerides: 77 mg/dL (ref 0.0–149.0)
VLDL: 15.4 mg/dL (ref 0.0–40.0)

## 2021-11-06 NOTE — Patient Instructions (Signed)
Stop by the lab prior to leaving today. I will notify you of your results once received.  ° °[facility] to schedule your mamm[facility].  ° °It was a pleasure to see you today! ° °Preventive Care 57-57 Years Old, Female °Preventive care refers to lifestyle choices and visits with your health care provider that can promote health and wellness. Preventive care visits are also called wellness exams. °What can I expect for my preventive care visit? °Counseling °Your health care provider may ask you questions about your: °Medical history, including: °Past medical problems. °Family medical history. °Pregnancy history. °Current health, including: °Menstrual cycle. °Method of birth control. °Emotional well-being. °Home life and relationship well-being. °Sexual activity and sexual health. °Lifestyle, including: °Alcohol, nicotine or tobacco, and drug use. °Access to firearms. °Diet, exercise, and sleep habits. °Work and work environment. °Sunscreen use. °Safety issues such as seatbelt and bike helmet use. °Physical exam °Your health care provider will check your: °Height and weight. These may be used to calculate your BMI (body mass index). BMI is a measurement that tells if you are at a healthy weight. °Waist circumference. This measures the distance around your waistline. This measurement also tells if you are at a healthy weight and may help predict your risk of certain diseases, such as type 2 diabetes and high blood pressure. °Heart rate and blood pressure. °Body temperature. °Skin for abnormal spots. °What immunizations do I need? °Vaccines are usually given at various ages, according to a schedule. Your health care provider will recommend vaccines for you based on your age, medical history, and lifestyle or other factors, such as travel or where you work. °What tests do I need? °Screening °Your health care provider may recommend screening tests for certain conditions. This may include: °Lipid and cholesterol  levels. °Diabetes screening. This is done by checking your blood sugar (glucose) after you have not eaten for a while (fasting). °Pelvic exam and Pap test. °Hepatitis B test. °Hepatitis C test. °HIV (human immunodeficiency virus) test. °STI (sexually transmitted infection) testing, if you are at risk. °Lung cancer screening. °Colorectal cancer screening. °Mammogram. Talk with your health care provider about when you should start having regular mammograms. This may depend on whether you have a family history of breast cancer. °BRCA-related cancer screening. This may be done if you have a family history of breast, ovarian, tubal, or peritoneal cancers. °Bone density scan. This is done to screen for osteoporosis. °Talk with your health care provider about your test results, treatment options, and if necessary, the need for more tests. °Follow these instructions at home: °Eating and drinking ° °Eat a diet that includes fresh fruits and vegetables, whole grains, lean protein, and low-fat dairy products. °Take vitamin and mineral supplements as recommended by your health care provider. °Do not drink alcohol if: °Your health care provider tells you not to drink. °You are pregnant, may be pregnant, or are planning to become pregnant. °If you drink alcohol: °Limit how much you have to 0-1 drink a day. °Know how much alcohol is in your drink. In the [place] one drink equals one 12 oz bottle of beer (355 mL), one 5 oz glass of wine (148 mL), or one 1½ oz glass of hard liquor (44 mL). °Lifestyle °Brush your teeth every morning and night with fluoride toothpaste. Floss one time each day. °Exercise for at least 30 minutes 5 or more days each week. °Do not use any products that contain nicotine or tobacco. These products include cigarettes, chewing tobacco,   and vaping devices, such as e-cigarettes. If you need help quitting, ask your health care provider. °Do not use drugs. °If you are sexually active, practice safe sex. Use a  condom or other form of protection to prevent STIs. °If you do not wish to become pregnant, use a form of birth control. If you plan to become pregnant, see your health care provider for a prepregnancy visit. °Take aspirin only as told by your health care provider. Make sure that you understand how much to take and what form to take. Work with your health care provider to find out whether it is safe and beneficial for you to take aspirin daily. °Find healthy ways to manage stress, such as: °Meditation, yoga, or listening to music. °Journaling. °Talking to a trusted person. °Spending time with friends and family. °Minimize exposure to UV radiation to reduce your risk of skin cancer. °Safety °Always wear your seat belt while driving or riding in a vehicle. °Do not drive: °If you have been drinking alcohol. Do not ride with someone who has been drinking. °When you are tired or distracted. °While texting. °If you have been using any mind-altering substances or drugs. °Wear a helmet and other protective equipment during sports activities. °If you have firearms in your house, make sure you follow all gun safety procedures. °Seek help if you have been physically or sexually abused. °What's next? °Visit your health care provider once a year for an annual wellness visit. °Ask your health care provider how often you should have your eyes and teeth checked. °Stay up to date on all vaccines. °This information is not intended to replace advice given to you by your health care provider. Make sure you discuss any questions you have with your health care provider. °Document Revised: 05/14/2021 Document Reviewed: 05/14/2021 °Elsevier Patient Education © 2022 [employer] ° °

## 2021-11-06 NOTE — Progress Notes (Signed)
Subjective:    Patient ID: Heidi [name], female    DOB: 1965/12/11, 55 y.o.   MRN: [id number]  HPI  Heidi [name] is a very pleasant 55 y.o. female who presents today for complete physical and follow up of chronic conditions.  Immunizations: -Tetanus: 2013, declines all vaccines moving forward -Influenza: Declines -Covid-19: Has not completed, declines  -Shingles: Shingrix  Diet: Fair diet.  Exercise: No regular exercise.   Eye exam: Completes annually  Dental exam: Completes semi-annually   Pap Smear: Completed in 2019, now with hysterectomy  Mammogram: Completed in December 2021 Colonoscopy: Completed around 6 years ago, could never get records. She believes she is due 10 years from screening date.   She does not check her BP outside of doctors offices    BP Readings from Last 3 Encounters:  11/06/21 128/76  11/05/20 132/82  12/06/19 134/84    Wt Readings from Last 3 Encounters:  11/06/21 141 lb (64 kg)  11/05/20 139 lb (63 kg)  12/06/19 144 lb 8 oz (65.5 kg)      Review of Systems  Constitutional:  Negative for unexpected weight change.  HENT:  Negative for rhinorrhea.   Eyes:  Negative for visual disturbance.  Respiratory:  Negative for cough and shortness of breath.   Cardiovascular:  Negative for chest pain.  Gastrointestinal:  Negative for constipation and diarrhea.  Genitourinary:  Negative for difficulty urinating and menstrual problem.  Musculoskeletal:  Negative for arthralgias and myalgias.  Skin:  Negative for rash.  Allergic/Immunologic: Negative for environmental allergies.  Neurological:  Negative for dizziness and headaches.  Psychiatric/Behavioral:  The patient is nervous/anxious.         Past Medical History:  Diagnosis Date   CHICKENPOX, HX OF 08/12/2009   Qualifier: Diagnosis of  By: Ellin Mayhew CMA (AAMA), Heather     COVID-19 virus infection     Social History   Socioeconomic History   Marital status: Married    Spouse  name: Not on file   Number of children: Not on file   Years of education: Not on file   Highest education level: Not on file  Occupational History   Not on file  Tobacco Use   Smoking status: Never   Smokeless tobacco: Never  Substance and Sexual Activity   Alcohol use: Not Currently   Drug use: Not on file   Sexual activity: Not on file  Other Topics Concern   Not on file  Social History Narrative   Married.   2 children.   Works as a Media planner.    Enjoys playing musical instruments, sewing, cooking, gardening.   Social Determinants of Health   Financial Resource Strain: Not on file  Food Insecurity: Not on file  Transportation Needs: Not on file  Physical Activity: Not on file  Stress: Not on file  Social Connections: Not on file  Intimate Partner Violence: Not on file    Past Surgical History:  Procedure Laterality Date   ABDOMINAL HYSTERECTOMY     BREAST BIOPSY Left 11/29/2019   [place] bx, stromal fibrosis, large mass heart clip   BREAST BIOPSY Left 11/29/2019   [place] bx, PASH, small mass coil clip   BREAST EXCISIONAL BIOPSY Left    years ago    Family History  Problem Relation Age of Onset   Atrial fibrillation Mother    Macular degeneration Mother    Hypertension Mother    Heart attack Father 55   CAD Father  Heart disease Brother    Heart attack Brother 52   Breast cancer Neg Hx     No Known Allergies  No current outpatient medications on file prior to visit.   No current facility-administered medications on file prior to visit.    BP 128/76   Pulse 75   Temp 97.7 F (36.5 C) (Temporal)   Ht 5' 2.5" (1.588 m)   Wt 141 lb (64 kg)   SpO2 98%   BMI 25.38 kg/m  Objective:   Physical Exam HENT:     Right Ear: Tympanic membrane and ear canal normal.     Left Ear: Tympanic membrane and ear canal normal.     Nose: Nose normal.  Eyes:     Conjunctiva/sclera: Conjunctivae normal.     Pupils: Pupils are equal, round, and reactive to light.   Neck:     Thyroid: No thyromegaly.  Cardiovascular:     Rate and Rhythm: Normal rate and regular rhythm.     Heart sounds: No murmur heard. Pulmonary:     Effort: Pulmonary effort is normal.     Breath sounds: Normal breath sounds. No rales.  Abdominal:     General: Bowel sounds are normal.     Palpations: Abdomen is soft.     Tenderness: There is no abdominal tenderness.  Musculoskeletal:        General: Normal range of motion.     Cervical back: Neck supple.  Lymphadenopathy:     Cervical: No cervical adenopathy.  Skin:    General: Skin is warm and dry.     Findings: No rash.  Neurological:     Mental Status: She is alert and oriented to person, place, and time.     Cranial Nerves: No cranial nerve deficit.     Deep Tendon Reflexes: Reflexes are normal and symmetric.  Psychiatric:        Mood and Affect: Mood normal.          Assessment & Plan:      This visit occurred during the SARS-CoV-2 public health emergency.  Safety protocols were in place, including screening questions prior to the visit, additional usage of staff PPE, and extensive cleaning of exam room while observing appropriate contact time as indicated for disinfecting solutions.

## 2021-11-06 NOTE — Assessment & Plan Note (Signed)
Declines all vaccines.  Hysterectomy history. Colonoscopy seems UTD, could not get records. Maybe due in 2025 or 2026? She agrees. Mammogram due, orders placed.  Discussed the importance of a healthy diet and regular exercise in order for weight loss, and to reduce the risk of further co-morbidity.  Exam today stable. Labs pending.

## 2021-11-06 NOTE — Assessment & Plan Note (Signed)
Checking lipid panel. Encouraged healthy diet and exercise.

## 2021-11-07 ENCOUNTER — Other Ambulatory Visit: Payer: Self-pay | Admitting: Primary Care

## 2021-11-07 DIAGNOSIS — Z1231 Encounter for screening mammogram for malignant neoplasm of breast: Secondary | ICD-10-CM

## 2021-11-11 ENCOUNTER — Ambulatory Visit
Admission: RE | Admit: 2021-11-11 | Discharge: 2021-11-11 | Disposition: A | Discharge status: Home / Self Care | Payer: No Typology Code available for payment source | Source: Ambulatory Visit | Attending: Primary Care | Admitting: Primary Care

## 2021-11-11 ENCOUNTER — Other Ambulatory Visit: Payer: Self-pay

## 2021-11-11 DIAGNOSIS — Z1231 Encounter for screening mammogram for malignant neoplasm of breast: Secondary | ICD-10-CM | POA: Insufficient documentation

## 2023-02-18 ENCOUNTER — Other Ambulatory Visit: Payer: Self-pay | Admitting: Primary Care

## 2023-02-18 DIAGNOSIS — Z1231 Encounter for screening mammogram for malignant neoplasm of breast: Secondary | ICD-10-CM

## 2023-02-23 ENCOUNTER — Ambulatory Visit (INDEPENDENT_AMBULATORY_CARE_PROVIDER_SITE_OTHER): Payer: 59 | Admitting: Primary Care

## 2023-02-23 ENCOUNTER — Encounter: Payer: Self-pay | Admitting: Primary Care

## 2023-02-23 VITALS — BP 142/88 | HR 80 | Temp 98.2°F | Ht 62.0 in | Wt 144.0 lb

## 2023-02-23 DIAGNOSIS — E785 Hyperlipidemia, unspecified: Secondary | ICD-10-CM | POA: Insufficient documentation

## 2023-02-23 DIAGNOSIS — Z0000 Encounter for general adult medical examination without abnormal findings: Secondary | ICD-10-CM | POA: Diagnosis not present

## 2023-02-23 NOTE — Progress Notes (Signed)
Subjective:    Patient ID: Heidi [name], female    DOB: [birth year], 57 y.o.   MRN: [id number]  HPI  Heidi [name] is a very pleasant 57 y.o. female who presents today for complete physical and follow up of chronic conditions.  Immunizations: -Tetanus: Completed in 2013, declines  -Shingles: Completed Shingrix series  Diet: Fair diet.  Exercise: Walking and active.   Eye exam: Completes annually Dental exam: Completes every 9 months.    Pap Smear: Hysterectomy  Mammogram: Scheduled for July 2024  Colonoscopy: Completed around 2016? She's unsure and we were never able to get records.    BP Readings from Last 3 Encounters:  02/23/23 (!) 142/88  11/06/21 128/76  11/05/20 132/82       Review of Systems  Constitutional:  Negative for unexpected weight change.  HENT:  Negative for rhinorrhea.   Respiratory:  Negative for cough and shortness of breath.   Cardiovascular:  Negative for chest pain.  Gastrointestinal:  Negative for constipation and diarrhea.  Genitourinary:  Negative for difficulty urinating.  Musculoskeletal:  Negative for arthralgias and myalgias.  Skin:  Negative for rash.  Allergic/Immunologic: Negative for environmental allergies.  Neurological:  Negative for dizziness, numbness and headaches.  Psychiatric/Behavioral:  The patient is not nervous/anxious.          Past Medical History:  Diagnosis Date   CHICKENPOX, HX OF 08/12/2009   Qualifier: Diagnosis of  By: Ellin Mayhew CMA (AAMA), Heather     COVID-19 virus infection     Social History   Socioeconomic History   Marital status: Married    Spouse name: Not on file   Number of children: Not on file   Years of education: Not on file   Highest education level: Not on file  Occupational History   Not on file  Tobacco Use   Smoking status: Never   Smokeless tobacco: Never  Substance and Sexual Activity   Alcohol use: Not Currently   Drug use: Not on file   Sexual activity: Not on  file  Other Topics Concern   Not on file  Social History Narrative   Married.   2 children.   Works as a Media planner.    Enjoys playing musical instruments, sewing, cooking, gardening.   Social Determinants of Health   Financial Resource Strain: Not on file  Food Insecurity: Not on file  Transportation Needs: Not on file  Physical Activity: Not on file  Stress: Not on file  Social Connections: Not on file  Intimate Partner Violence: Not on file    Past Surgical History:  Procedure Laterality Date   ABDOMINAL HYSTERECTOMY     BREAST BIOPSY Left 11/29/2019   [place] bx, stromal fibrosis, large mass heart clip   BREAST BIOPSY Left 11/29/2019   [place] bx, PASH, small mass coil clip   BREAST EXCISIONAL BIOPSY Left    years ago    Family History  Problem Relation Age of Onset   Atrial fibrillation Mother    Macular degeneration Mother    Hypertension Mother    Heart attack Father 82   CAD Father    Heart disease Brother    Heart attack Brother 83   Breast cancer Neg Hx     No Known Allergies  No current outpatient medications on file prior to visit.   No current facility-administered medications on file prior to visit.    BP (!) 142/88   Pulse 80   Temp 98.2 F (36.8 C) (  Temporal)   Ht 5\' 2"  (1.575 m)   Wt 144 lb (65.3 kg)   SpO2 99%   BMI 26.34 kg/m  Objective:   Physical Exam HENT:     Right Ear: Tympanic membrane and ear canal normal.     Left Ear: Tympanic membrane and ear canal normal.     Nose: Nose normal.  Eyes:     Conjunctiva/sclera: Conjunctivae normal.     Pupils: Pupils are equal, round, and reactive to light.  Neck:     Thyroid: No thyromegaly.  Cardiovascular:     Rate and Rhythm: Normal rate and regular rhythm.     Heart sounds: No murmur heard. Pulmonary:     Effort: Pulmonary effort is normal.     Breath sounds: Normal breath sounds. No rales.  Abdominal:     General: Bowel sounds are normal.     Palpations: Abdomen is soft.      Tenderness: There is no abdominal tenderness.  Musculoskeletal:        General: Normal range of motion.     Cervical back: Neck supple.  Lymphadenopathy:     Cervical: No cervical adenopathy.  Skin:    General: Skin is warm and dry.     Findings: No rash.  Neurological:     Mental Status: She is alert and oriented to person, place, and time.     Cranial Nerves: No cranial nerve deficit.     Deep Tendon Reflexes: Reflexes are normal and symmetric.  Psychiatric:        Mood and Affect: Mood normal.           Assessment & Plan:  Preventative health care Assessment & Plan: Declines all vaccines.  Mammogram scheduled.  Colonoscopy UTD, due around 2026 or 2027. She has documentation at home and will provide this to [place].  Discussed the importance of a healthy diet and regular exercise in order for weight loss, and to reduce the risk of further co-morbidity.  Exam stable. Labs pending.  Follow up in 1 year for repeat physical.   Orders: -     Lipid panel -     CBC -     Comprehensive metabolic panel -     TSH  Hyperlipidemia, unspecified hyperlipidemia type Assessment & Plan: Repeat lipid panel pending.  Discussed the importance of a healthy diet and regular exercise in order for weight loss, and to reduce the risk of further co-morbidity.   Orders: -     Lipid panel -     CBC -     Comprehensive metabolic panel -     TSH        Pleas Koch, NP

## 2023-02-23 NOTE — Assessment & Plan Note (Signed)
Repeat lipid panel pending. ° °Discussed the importance of a healthy diet and regular exercise in order for weight loss, and to reduce the risk of further co-morbidity. ° °

## 2023-02-23 NOTE — Assessment & Plan Note (Signed)
Declines all vaccines.  Mammogram scheduled.  Colonoscopy UTD, due around 2026 or 2027. She has documentation at home and will provide this to [place].  Discussed the importance of a healthy diet and regular exercise in order for weight loss, and to reduce the risk of further co-morbidity.  Exam stable. Labs pending.  Follow up in 1 year for repeat physical.

## 2023-02-24 LAB — CBC
HCT: 41.8 % (ref 36.0–46.0)
Hemoglobin: 14.2 g/dL (ref 12.0–15.0)
MCHC: 33.9 g/dL (ref 30.0–36.0)
MCV: 90 fl (ref 78.0–100.0)
Platelets: 269 10*3/uL (ref 150.0–400.0)
RBC: 4.64 Mil/uL (ref 3.87–5.11)
RDW: 13.7 % (ref 11.5–15.5)
WBC: 5.6 10*3/uL (ref 4.0–10.5)

## 2023-02-24 LAB — COMPREHENSIVE METABOLIC PANEL
ALT: 24 U/L (ref 0–35)
AST: 18 U/L (ref 0–37)
Albumin: 4.6 g/dL (ref 3.5–5.2)
Alkaline Phosphatase: 57 U/L (ref 39–117)
BUN: 12 mg/dL (ref 6–23)
CO2: 27 mEq/L (ref 19–32)
Calcium: 9.6 mg/dL (ref 8.4–10.5)
Chloride: 102 mEq/L (ref 96–112)
Creatinine, Ser: 0.74 mg/dL (ref 0.40–1.20)
GFR: 89.17 mL/min (ref >60.00)
Glucose, Bld: 87 mg/dL (ref 70–99)
Potassium: 3.9 mEq/L (ref 3.5–5.1)
Sodium: 137 mEq/L (ref 135–145)
Total Bilirubin: 0.4 mg/dL (ref 0.2–1.2)
Total Protein: 7.4 g/dL (ref 6.0–8.3)

## 2023-02-24 LAB — LIPID PANEL
Cholesterol: 201 mg/dL — ABNORMAL HIGH (ref 0–200)
HDL: 62.6 mg/dL (ref >39.00)
LDL Cholesterol: 108 mg/dL — ABNORMAL HIGH (ref 0–99)
NonHDL: 138.71
Total CHOL/HDL Ratio: 3
Triglycerides: 153 mg/dL — ABNORMAL HIGH (ref 0.0–149.0)
VLDL: 30.6 mg/dL (ref 0.0–40.0)

## 2023-02-24 LAB — TSH: TSH: 2.05 u[IU]/mL (ref 0.35–5.50)

## 2023-02-25 ENCOUNTER — Encounter: Payer: Self-pay | Admitting: Primary Care

## 2023-02-25 ENCOUNTER — Telehealth: Payer: Self-pay | Admitting: Primary Care

## 2023-02-25 NOTE — Telephone Encounter (Signed)
Colonoscopy report requested from [facility] office.

## 2023-02-25 NOTE — Telephone Encounter (Signed)
We need to obtain her last colonoscopy report from GI.  It looks like she was once seeing Dr. Earlean Shawl.  Can we have her sign a medical release form so that we can request?

## 2023-04-15 ENCOUNTER — Encounter: Payer: Self-pay | Admitting: Primary Care

## 2023-04-15 NOTE — Telephone Encounter (Signed)
I still haven't received her prior colonoscopy. Can we call over to Dr. Jennye Boroughs office to check?

## 2023-04-15 NOTE — Telephone Encounter (Signed)
Called and spoke with receptionist at [facility]- Dr Sharrell Ku office (he is retired now).  Re- faxed over medical records request to number they provided.  Fax # :[phone]

## 2023-04-30 NOTE — Telephone Encounter (Signed)
Called and advised patient, she stated that she had her colonoscopy done on the last day they were located in his [place] office. That office is not longer in business and there is no contact as well and when Dr. Kinnie Scales left that office he went to work for [employer]. She stated she has attempted to get her records from the colonoscopy as well and was unsuccessful. She appreciated the efforts to try and locate this.

## 2023-04-30 NOTE — Telephone Encounter (Signed)
Noted  

## 2023-04-30 NOTE — Telephone Encounter (Signed)
Please notify patient of our efforts to track down her colonoscopy.  Let her know that Dr. Jennye Boroughs office does not has record of this. Does she believe she had her colonoscopy elsewhere?

## 2023-06-14 ENCOUNTER — Ambulatory Visit
Admission: RE | Admit: 2023-06-14 | Discharge: 2023-06-14 | Disposition: A | Discharge status: Home / Self Care | Payer: 59 | Source: Ambulatory Visit | Attending: Primary Care | Admitting: Primary Care

## 2023-06-14 DIAGNOSIS — Z1231 Encounter for screening mammogram for malignant neoplasm of breast: Secondary | ICD-10-CM | POA: Diagnosis not present

## 2023-08-25 IMAGING — MG MM DIGITAL SCREENING BILAT W/ TOMO AND CAD
8 series · 8 of 24 positions shown · non-contrast
Comparison: Previous exam(s).

CLINICAL DATA: Screening.

EXAM:
DIGITAL SCREENING BILATERAL MAMMOGRAM WITH TOMOSYNTHESIS AND CAD
TECHNIQUE: Bilateral screening digital craniocaudal and mediolateral oblique
mammograms were obtained. Bilateral screening digital breast
tomosynthesis was performed. The images were evaluated with
computer-aided detection.

[R CC synth-2D]
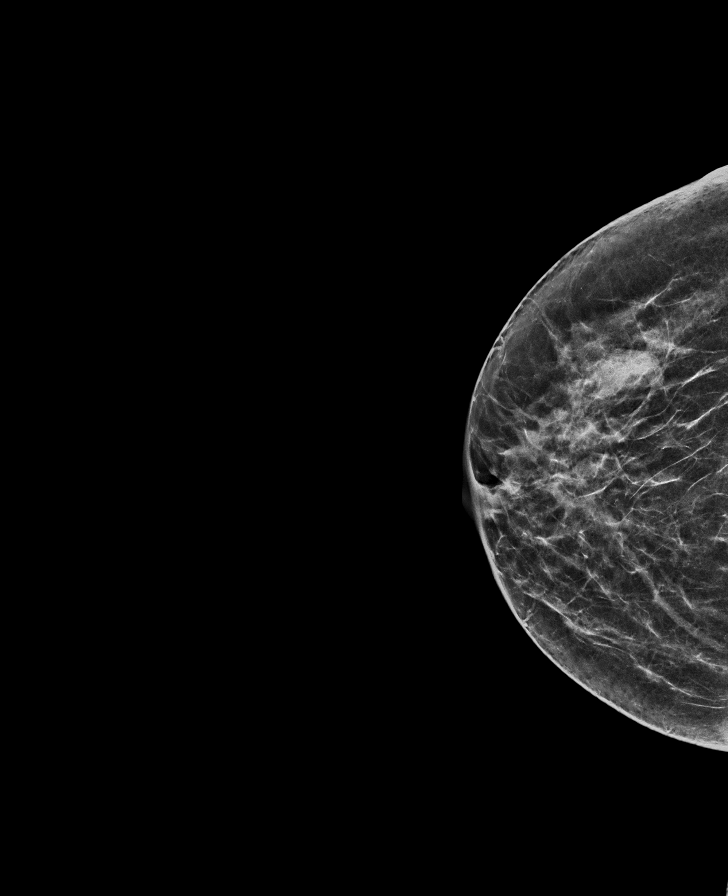

[L CC synth-2D]
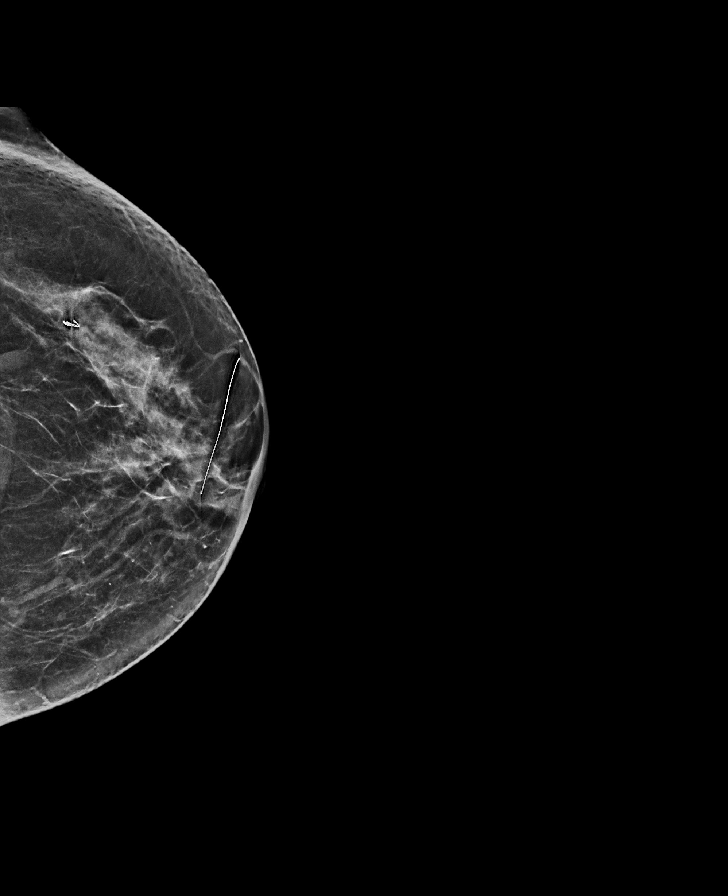

[R MLO synth-2D]
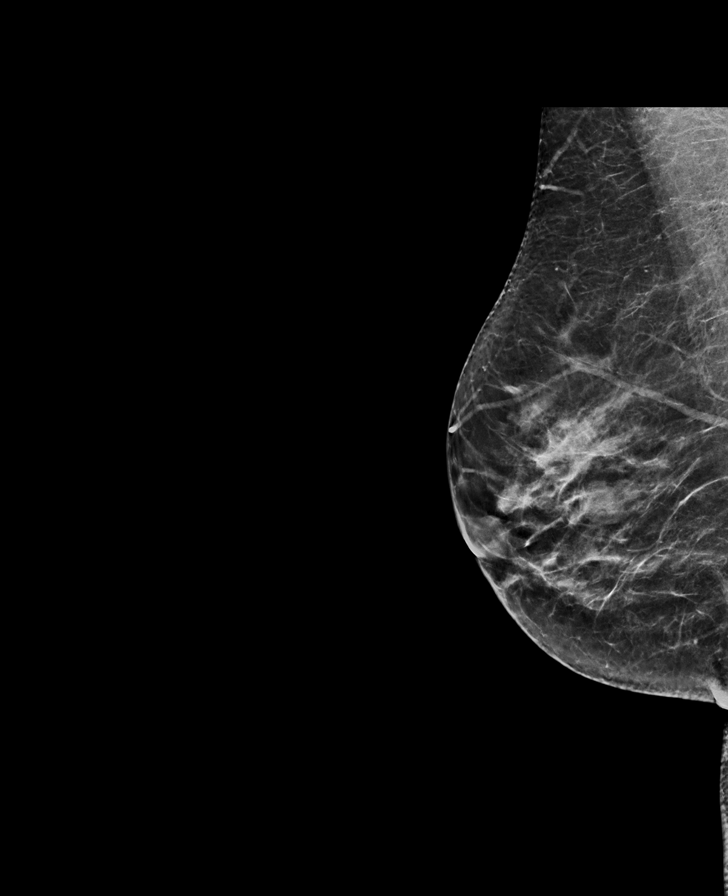

[L MLO synth-2D]
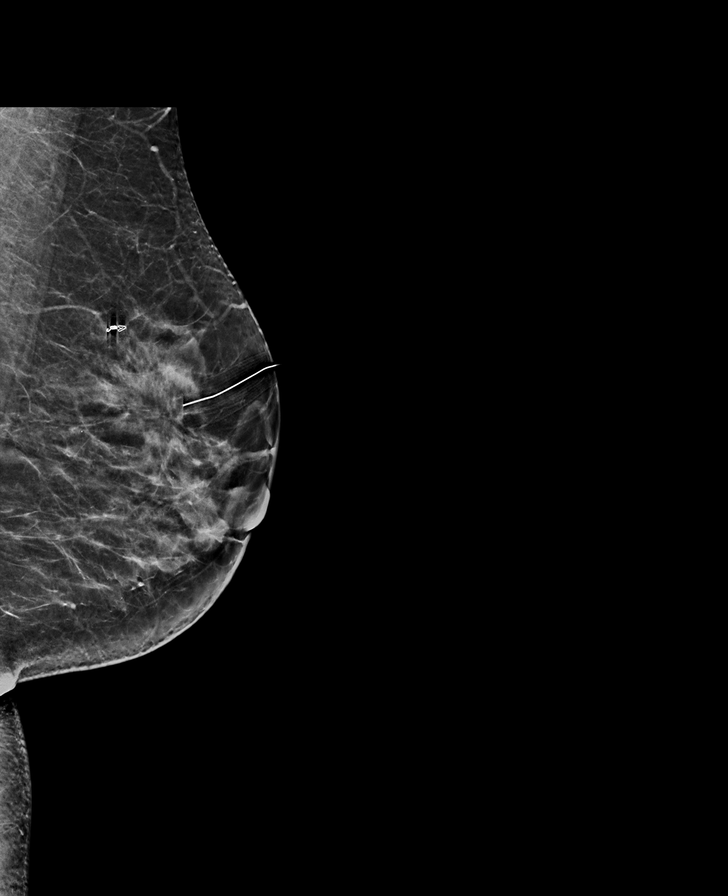

[L CC tomo · tomo slice 32/63.0]
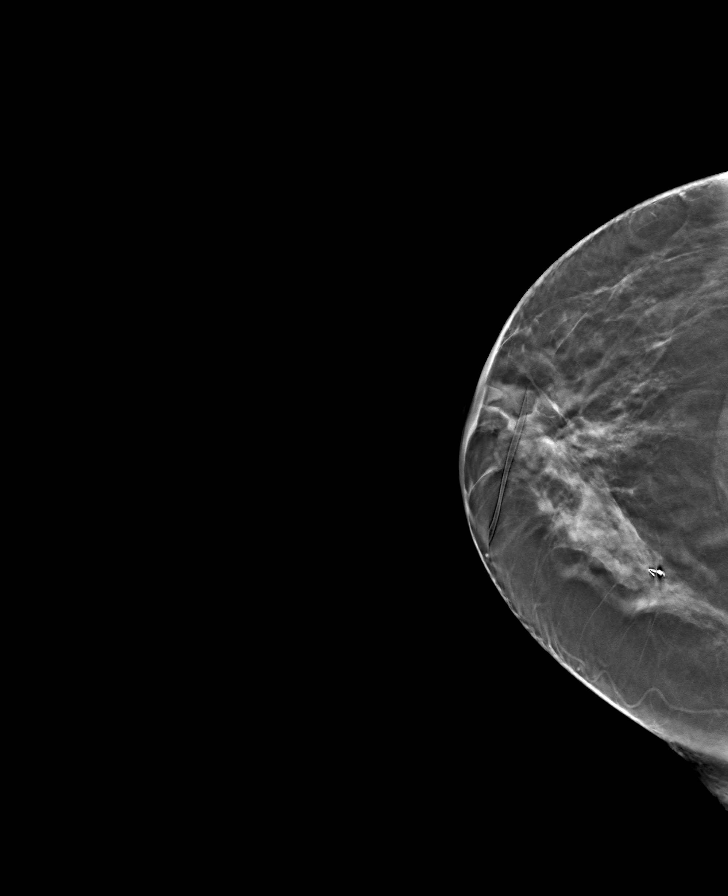

[R CC tomo · tomo slice 31/60.0]
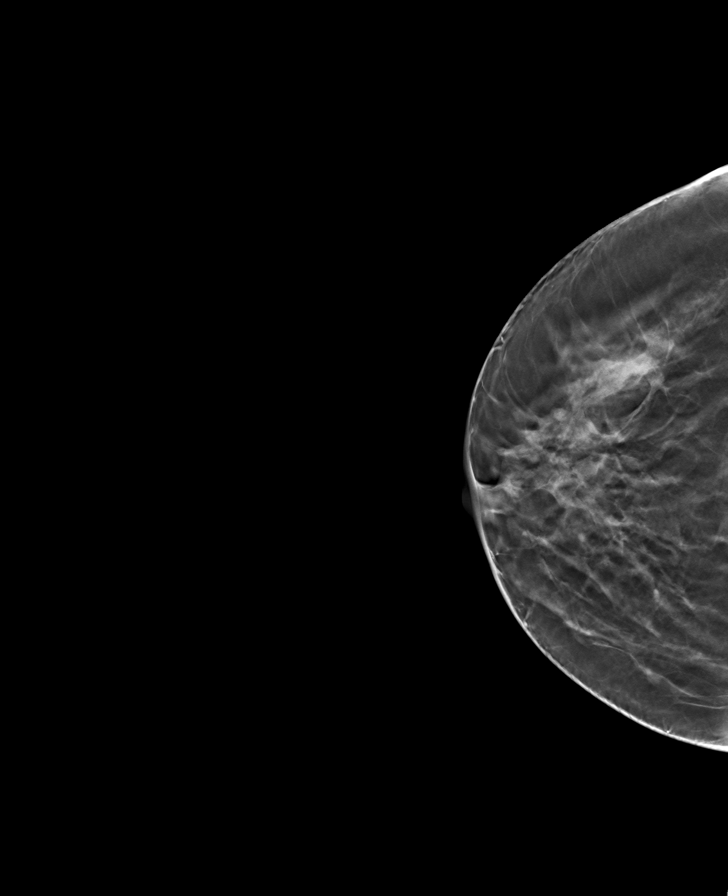

[R MLO tomo · tomo slice 33/66.0]
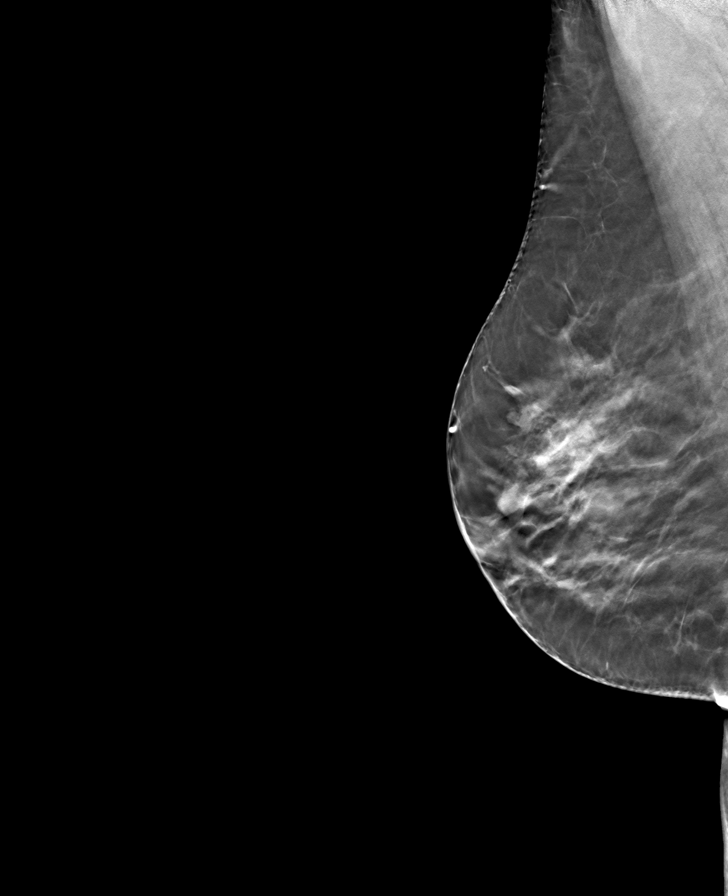

[L MLO tomo · tomo slice 32/63.0]
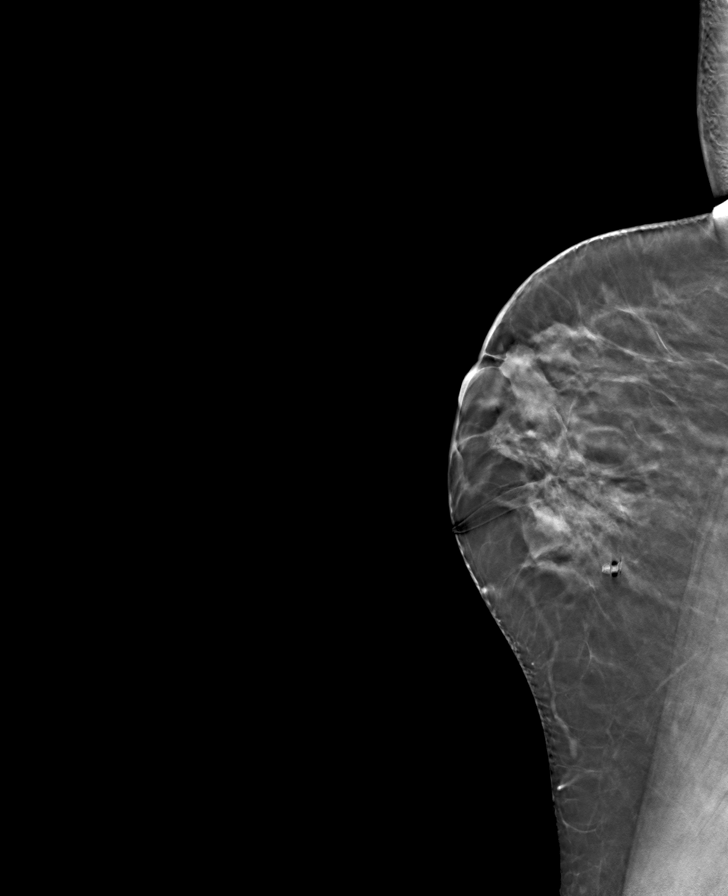

[8 of 24 positions shown; findings below may reference images not displayed]

ACR Breast Density Category c: The breast tissue is heterogeneously
dense, which may obscure small masses.
FINDINGS: There are no findings suspicious for malignancy.
IMPRESSION: No mammographic evidence of malignancy. A result letter of this
screening mammogram will be mailed directly to the patient.

RECOMMENDATION:
Screening mammogram in one year. (Code:[id number]-BC3)

BI-RADS CATEGORY  1: Negative.
